# Patient Record
Sex: Female | Born: 1970 | Race: Black or African American | Hispanic: No | Marital: Married | Smoking: Never smoker
Health system: Southern US, Community
[De-identification: ages and names within clinical notes are randomized; demographics above are authoritative.]

## PROBLEM LIST (undated history)

## (undated) DIAGNOSIS — T7840XA Allergy, unspecified, initial encounter: Secondary | ICD-10-CM

## (undated) DIAGNOSIS — F419 Anxiety disorder, unspecified: Secondary | ICD-10-CM

## (undated) DIAGNOSIS — F32A Depression, unspecified: Secondary | ICD-10-CM

## (undated) DIAGNOSIS — I10 Essential (primary) hypertension: Secondary | ICD-10-CM

## (undated) DIAGNOSIS — F329 Major depressive disorder, single episode, unspecified: Secondary | ICD-10-CM

## (undated) HISTORY — PX: REDUCTION MAMMAPLASTY: SUR839

## (undated) HISTORY — PX: COSMETIC SURGERY: SHX468

## (undated) HISTORY — DX: Depression, unspecified: F32.A

## (undated) HISTORY — DX: Allergy, unspecified, initial encounter: T78.40XA

## (undated) HISTORY — DX: Anxiety disorder, unspecified: F41.9

## (undated) HISTORY — DX: Essential (primary) hypertension: I10

## (undated) HISTORY — DX: Major depressive disorder, single episode, unspecified: F32.9

## (undated) HISTORY — PX: OTHER SURGICAL HISTORY: SHX169

## (undated) HISTORY — PX: BREAST SURGERY: SHX581

---

## 2000-09-20 ENCOUNTER — Inpatient Hospital Stay (HOSPITAL_COMMUNITY): Admission: AD | Admit: 2000-09-20 | Discharge: 2000-09-23 | Payer: Self-pay | Admitting: *Deleted

## 2000-09-20 ENCOUNTER — Encounter (INDEPENDENT_AMBULATORY_CARE_PROVIDER_SITE_OTHER): Payer: Self-pay | Admitting: Specialist

## 2005-04-03 ENCOUNTER — Other Ambulatory Visit: Admission: RE | Admit: 2005-04-03 | Discharge: 2005-04-03 | Payer: Self-pay | Admitting: Obstetrics and Gynecology

## 2006-07-12 ENCOUNTER — Other Ambulatory Visit: Admission: RE | Admit: 2006-07-12 | Discharge: 2006-07-12 | Payer: Self-pay | Admitting: Obstetrics and Gynecology

## 2007-10-02 ENCOUNTER — Other Ambulatory Visit: Admission: RE | Admit: 2007-10-02 | Discharge: 2007-10-02 | Payer: Self-pay | Admitting: Obstetrics and Gynecology

## 2008-12-24 ENCOUNTER — Encounter: Admission: RE | Admit: 2008-12-24 | Discharge: 2008-12-24 | Payer: Self-pay | Admitting: Plastic Surgery

## 2008-12-29 ENCOUNTER — Encounter: Admission: RE | Admit: 2008-12-29 | Discharge: 2008-12-29 | Payer: Self-pay | Admitting: Plastic Surgery

## 2009-01-05 ENCOUNTER — Encounter (INDEPENDENT_AMBULATORY_CARE_PROVIDER_SITE_OTHER): Payer: Self-pay | Admitting: Plastic Surgery

## 2009-01-05 ENCOUNTER — Ambulatory Visit (HOSPITAL_BASED_OUTPATIENT_CLINIC_OR_DEPARTMENT_OTHER): Admission: RE | Admit: 2009-01-05 | Discharge: 2009-01-06 | Payer: Self-pay | Admitting: Plastic Surgery

## 2009-03-24 ENCOUNTER — Other Ambulatory Visit: Admission: RE | Admit: 2009-03-24 | Discharge: 2009-03-24 | Payer: Self-pay | Admitting: Obstetrics and Gynecology

## 2009-07-08 ENCOUNTER — Encounter: Admission: RE | Admit: 2009-07-08 | Discharge: 2009-07-08 | Payer: Self-pay | Admitting: Internal Medicine

## 2010-02-02 ENCOUNTER — Encounter: Admission: RE | Admit: 2010-02-02 | Discharge: 2010-02-02 | Payer: Self-pay | Admitting: Internal Medicine

## 2010-04-27 ENCOUNTER — Encounter: Admission: RE | Admit: 2010-04-27 | Discharge: 2010-04-27 | Payer: Self-pay | Admitting: Internal Medicine

## 2010-07-17 ENCOUNTER — Encounter: Payer: Self-pay | Admitting: Internal Medicine

## 2010-07-26 ENCOUNTER — Other Ambulatory Visit (HOSPITAL_COMMUNITY): Admission: RE | Admit: 2010-07-26 | Payer: 59 | Source: Ambulatory Visit | Admitting: Obstetrics and Gynecology

## 2010-07-26 ENCOUNTER — Other Ambulatory Visit (HOSPITAL_COMMUNITY)
Admission: RE | Admit: 2010-07-26 | Discharge: 2010-07-26 | Disposition: A | Discharge status: Home / Self Care | Payer: 59 | Source: Ambulatory Visit | Attending: Obstetrics and Gynecology | Admitting: Obstetrics and Gynecology

## 2010-07-26 ENCOUNTER — Other Ambulatory Visit: Payer: Self-pay | Admitting: Obstetrics and Gynecology

## 2010-07-26 DIAGNOSIS — Z01419 Encounter for gynecological examination (general) (routine) without abnormal findings: Secondary | ICD-10-CM | POA: Insufficient documentation

## 2010-07-26 DIAGNOSIS — Z113 Encounter for screening for infections with a predominantly sexual mode of transmission: Secondary | ICD-10-CM | POA: Insufficient documentation

## 2010-10-02 LAB — POCT I-STAT, CHEM 8
BUN: 14 mg/dL (ref 6–23)
Creatinine, Ser: 0.8 mg/dL (ref 0.4–1.2)
Hemoglobin: 14.3 g/dL (ref 12.0–15.0)
Potassium: 4.5 mEq/L (ref 3.5–5.1)
Sodium: 139 mEq/L (ref 135–145)
TCO2: 28 mmol/L (ref 0–100)

## 2010-10-02 LAB — POCT HEMOGLOBIN-HEMACUE: Hemoglobin: 12.3 g/dL (ref 12.0–15.0)

## 2010-11-08 NOTE — Op Note (Signed)
Christine Lloyd               ACCOUNT NO.:  [id number]   MEDICAL RECORD NO.:  [id number]          PATIENT TYPE:  AMB   LOCATION:  DSC                          FACILITY:  [facility]   PHYSICIAN:  Brantley Persons, M.D.DATE OF BIRTH:  31-Mar-1971   DATE OF PROCEDURE:  01/05/2009  DATE OF DISCHARGE:                               OPERATIVE REPORT   PREOPERATIVE DIAGNOSIS:  Bilateral macromastia.   POSTOPERATIVE DIAGNOSIS:  Bilateral macromastia.   PROCEDURE:  Bilateral reduction mammoplasties.   ATTENDING SURGEON:  Brantley Persons, MD   ANESTHESIA:  General.   ANESTHESIOLOGIST:  Sheldon Silvan, MD   ESTIMATED BLOOD LOSS:  150 mL.   FLUID REPLACEMENT:  3100 mL of crystalloid.   URINE OUTPUT:  310 mL.   COMPLICATIONS:  None.   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  She presents to undergo bilateral reduction mammoplasties.   PROCEDURE:  The patient was marked in preop holding area in the pattern  of Wise for the future bilateral reduction mammoplasties.  She was then  taken back to the OR, placed on the table in supine position.  After  adequate general anesthesia was obtained, the patient's chest was  prepped with Techni-Care and draped in sterile fashion.  The base of the  breasts had been injected with 1% lidocaine with epinephrine.  After  adequate hemostasis and anesthesia had taken effect, the procedure was  begun.   Both of the breast reductions were performed in the following similar  manner.  The nipple-areolar complexes were marked with a 45-mm nipple  marker.  The skin was then incised and de-epithelialized around the  nipple-areolar complex down to the inframammary crease in the inferior  pedicle pattern.  Next, the medial, superior, and lateral skin flaps  were elevated down to the chest wall.  The excess fat and glandular  tissue were removed from the  inferior pedicle.  The nipple-areolar  complex was examined and found to be pink and viable.  The wound was  irrigated with saline irrigation.  Meticulous hemostasis was obtained  with the Bovie electrocautery.  The inferior pedicle was centralized  using 3-0 Prolene suture.  A #10 JP flat fully fluted drain was placed  into the wound.  The skin flaps were then brought together at the  inverted T junction with a 2-0 Prolene suture.  The incisions were  stapled for temporary closure.  The breasts compared and found to have  good shape and symmetry.  The incisions were then closed from the medial  aspect of the JP drain to the medial aspect of the [facility] incision by first  placing a few 3-0 Monocryl sutures in the dermal layer to loosely tack  together and then both the dermal and cuticular layer were closed in a  single layer using a 2-0 Quill PDO barbed suture.  Lateral to the JP  drain, incision was closed using 3-0 Monocryl in the dermal layer,  followed by 3-0 Monocryl running intracuticular stitch on the skin.  The  vertical  limb of the Wise pattern was then closed using 3-0 Monocryl  suture.   The patient was then placed in the upright position.  The future  location of the nipple-areolar complexes was marked on both breast  mounds using the 45-mm nipple marker.  She was then placed back in the  recumbent position.   Both of the nipple-areolar complexes were brought out onto the breast  mounds in the following similar manner.  The skin was incised as marked  and excised full thickness into the soft tissue.  The nipple-areolar  complex was examined, felt to be pink and viable, then brought out  through this aperture and sewn in place using 4-0 Monocryl on the dermal  layer, followed by 5-0 Monocryl running intracuticular stitch on the  skin.  The 5-0 Monocryl suture was then brought down to close the  cuticular layer of the vertical limb as well.  The JP drain was sewn in  place using  3-0 nylon suture.  The incisions were dressed with benzoin  and Steri-Strips, and the nipples additionally with bacitracin ointment  and Adaptic.  The base of the breast and skin soft tissues were injected  with 0.5% Marcaine with epinephrine to provide a postsurgical anesthetic  block.  The incisions were dressed additionally with 4x4s and ABD pads  in the axillary areas.  The patient was then placed into a light  postoperative support bra.  There were are no complications.  The  patient tolerated the procedure well.  The final needle and sponge  counts were reported to be correct at the end of the case.   The patient was then awakened from general anesthesia.  She was then  taken to the recovery room and recovered without any complications.  It  is planned for the patient to go home from the [facility] rather  than stay overnight, this is appropriate.  Prior to discharge, both the  patient and her husband were given proper postoperative wound care  instructions including care of the JP drain.  She will then be  discharged home once stable in the care of her husband.   Follow up appointment will be Friday in the office.           ______________________________  Brantley Persons, M.D.     MC/MEDQ  D:  01/05/2009  T:  01/06/2009  Job:  [id number]

## 2010-11-11 NOTE — Op Note (Signed)
[place] [facility]  Patient:    Christine Lloyd, Christine Lloyd                        MRN: [id number] Proc. Date: 09/20/00 Attending:  Kathreen Cosier, M.D.                           Operative Report  PREOPERATIVE DIAGNOSIS:       Nonreassuring fetal heart rate tracing.  SURGEON:                      Kathreen Cosier, M.D.  ANESTHESIA:                   Epidural.  PROCEDURE:                    The patient was placed on the operating table in the supine position.  Abdomen was prepped and draped.  Bladder emptied with a Foley catheter.  A transverse suprapubic incision was made and carried down through the fascia.  The fascia was cleaned and incised the length of the incision.  Erectile muscles were retracted laterally.  The peritoneum was incised longitudinally.  A transverse incision made on the viscera peritoneum above the bladder, and the bladder mobilized inferiorly.  A transverse low uterine incision was made.  Fluid was slightly meconium stained.  The baby was DeLeed prior to delivery of the shoulders, and the fluid appeared to clear. There was no nuchal cord and the baby was delivered in the OP position, of a female Apgar 9 and 9, weighing 7 pounds 10 ounces.  The cord pH was 7.23. Placenta was anterior and removed manually.  The placenta was sent to pathology.  Uterine cavity cleaned with dry laps.  Uterine incision closed with interlocking suture of #1 chromic, including myometrium and endometrium. Myometrium embrocated with #1 chromic.  Hemostasis was satisfactory.  Bladder flap reattached with 2-0 chromic.  The uterus was contracted.  Tubes and ovaries normal.  Abdomen closed in layers with the peritoneum continuous with 2-0 chromic, fascia continuous with 2-0 Dexon.  Skin was closed with subcuticular stitch of 0 plain.  Estimated blood loss: 500 cc. DD:  09/20/00 TD:  09/21/00 Job: [id number]

## 2010-11-11 NOTE — Discharge Summary (Signed)
[facility]  Patient:    SABEEN, PIECHOCKI                        MRN: [id number] Adm. Date:  [id number] Disc. Date: [id number] Attending:  Venita Sheffield                           Discharge Summary  HISTORY:                      The patient is a 40 year old gravida 3, para 1-0-1-1, [facility] October 04, 2000, who was admitted in labor, 4-5 cm, 80%, vertex at -3. She had a negative GBS.  HOSPITAL COURSE:              The patient progressed to 7 cm, 100%, vertex, -2. She started having late decelerations. Membranes were ruptured and the fluid was meconium stained. Amnioinfusion was begun; however, she had no change in decelerations, still having late decelerations. She was given terbutaline 0.25 mg subcutaneously while preparing for a cesarean section. She had a low transverse cesarean section. She had a female from the OP position, Apgars 9 and 9. Cord pH was 7.23. The baby weighed 7 pounds 10 ounces. Placenta was anterior and was sent to pathology. Postoperatively, she did well and was discharged home on the third postoperative day ambulatory and on a regular diet.  DISCHARGE MEDICATIONS:        Tylenol #3.  DISCHARGE FOLLOWUP:           The patient is to see me in six weeks.  DISCHARGE DIAGNOSIS:          Primary low transverse cesarean section at term for a nonreassuring fetal heart rate tracing. DD:  10/25/00 TD:  10/25/00 Job: [id number]

## 2010-11-11 NOTE — H&P (Signed)
[facility]  Patient:    Christine Lloyd, Christine Lloyd                        MRN: [id number] Adm. Date:  [id number] Attending:  Venita Sheffield                         History and Physical                                Patient is a 40 year old gravida 3, para 1-0-0-1 [facility] October 04, 2000 who was admitted in labor at 7:10 a.m. on September 20, 2000. The cervix was 4-5 cm, 80% with the vertex at a -3 and membranes bulging.  She had a negative group B strep.  The patient was seen at approximately 11:30. She was 7 cm.  She already had an epidural in and it was noted that she was having occasional variables and also some late decelerations.  She had already been ______ to administration.  Cervix was 7 cm, 100% with a vertex of -2. Membranes were ruptured and the fluid was meconium stained.  An IUPC and scalp lead were applied and amnio-infusion began.  With each contraction she had large late decelerations.  No change with O2 or position change.  She was given terbutaline 0.25 subcutaneously to stop her contractions while the OR was prepared.  It was decided she would deliver by cesarean section for nonreassuring fetal heart rate tracing.  PHYSICAL EXAMINATION  GENERAL:                      Well-developed female.  HEENT:                        Negative.  LUNGS:                        Clear.  HEART:                        Regular rhythm.  No murmurs or gallops.  ABDOMEN:                      Term sized uterus.  PELVIC:                       As described above.  EXTREMITIES:                  Negative. DD:  09/20/00 TD:  09/20/00 Job: [id number]

## 2011-12-04 ENCOUNTER — Other Ambulatory Visit: Payer: Self-pay | Admitting: Internal Medicine

## 2011-12-04 DIAGNOSIS — Z1231 Encounter for screening mammogram for malignant neoplasm of breast: Secondary | ICD-10-CM

## 2011-12-11 ENCOUNTER — Ambulatory Visit
Admission: RE | Admit: 2011-12-11 | Discharge: 2011-12-11 | Disposition: A | Discharge status: Home / Self Care | Payer: 59 | Source: Ambulatory Visit | Attending: Internal Medicine | Admitting: Internal Medicine

## 2011-12-11 DIAGNOSIS — Z1231 Encounter for screening mammogram for malignant neoplasm of breast: Secondary | ICD-10-CM

## 2012-01-23 ENCOUNTER — Other Ambulatory Visit: Payer: Self-pay | Admitting: Obstetrics and Gynecology

## 2012-01-23 ENCOUNTER — Other Ambulatory Visit (HOSPITAL_COMMUNITY)
Admission: RE | Admit: 2012-01-23 | Discharge: 2012-01-23 | Disposition: A | Discharge status: Home / Self Care | Payer: 59 | Source: Ambulatory Visit | Attending: Obstetrics and Gynecology | Admitting: Obstetrics and Gynecology

## 2012-01-23 DIAGNOSIS — Z1151 Encounter for screening for human papillomavirus (HPV): Secondary | ICD-10-CM | POA: Insufficient documentation

## 2012-01-23 DIAGNOSIS — Z01419 Encounter for gynecological examination (general) (routine) without abnormal findings: Secondary | ICD-10-CM | POA: Insufficient documentation

## 2012-12-06 ENCOUNTER — Ambulatory Visit: Payer: 59 | Admitting: Physician Assistant

## 2012-12-06 VITALS — BP 164/94 | HR 95 | Temp 98.5°F | Resp 16 | Ht 63.0 in | Wt 195.0 lb

## 2012-12-06 DIAGNOSIS — N760 Acute vaginitis: Secondary | ICD-10-CM

## 2012-12-06 DIAGNOSIS — B373 Candidiasis of vulva and vagina: Secondary | ICD-10-CM

## 2012-12-06 DIAGNOSIS — B3731 Acute candidiasis of vulva and vagina: Secondary | ICD-10-CM

## 2012-12-06 DIAGNOSIS — N899 Noninflammatory disorder of vagina, unspecified: Secondary | ICD-10-CM

## 2012-12-06 DIAGNOSIS — N898 Other specified noninflammatory disorders of vagina: Secondary | ICD-10-CM

## 2012-12-06 DIAGNOSIS — B9689 Other specified bacterial agents as the cause of diseases classified elsewhere: Secondary | ICD-10-CM

## 2012-12-06 LAB — POCT URINALYSIS DIPSTICK
Bilirubin, UA: NEGATIVE
Glucose, UA: NEGATIVE
Ketones, UA: NEGATIVE
Nitrite, UA: NEGATIVE
pH, UA: 5.5

## 2012-12-06 LAB — POCT WET PREP WITH KOH: Yeast Wet Prep HPF POC: POSITIVE

## 2012-12-06 LAB — POCT UA - MICROSCOPIC ONLY: Yeast, UA: NEGATIVE

## 2012-12-06 MED ORDER — FLUCONAZOLE 150 MG PO TABS: 150.0000 mg | ORAL_TABLET | Freq: Once | ORAL | Status: DC

## 2012-12-06 MED ORDER — METRONIDAZOLE 500 MG PO TABS: 500.0000 mg | ORAL_TABLET | Freq: Two times a day (BID) | ORAL | Status: DC

## 2012-12-06 NOTE — Patient Instructions (Addendum)
Take 1 fluconazole (Diflucan) pill today.  Begin taking the metronidazole (Flagyl) today as well.  Be sure to finish the full course.  On the day after you finish the Flagyl, take the other fluconazole.  Please let me know if symptoms are not improving.   Bacterial Vaginosis Bacterial vaginosis (BV) is a vaginal infection where the normal balance of bacteria in the vagina is disrupted. The normal balance is then replaced by an overgrowth of certain bacteria. There are several different kinds of bacteria that can cause BV. BV is the most common vaginal infection in women of childbearing age. CAUSES   The cause of BV is not fully understood. BV develops when there is an increase or imbalance of harmful bacteria.  Some activities or behaviors can upset the normal balance of bacteria in the vagina and put women at increased risk including:  Having a new sex partner or multiple sex partners.  Douching.  Using an intrauterine device (IUD) for contraception.  It is not clear what role sexual activity plays in the development of BV. However, women that have never had sexual intercourse are rarely infected with BV. Women do not get BV from toilet seats, bedding, swimming pools or from touching objects around them.  SYMPTOMS   Grey vaginal discharge.  A fish-like odor with discharge, especially after sexual intercourse.  Itching or burning of the vagina and vulva.  Burning or pain with urination.  Some women have no signs or symptoms at all. DIAGNOSIS  Your caregiver must examine the vagina for signs of BV. Your caregiver will perform lab tests and look at the sample of vaginal fluid through a microscope. They will look for bacteria and abnormal cells (clue cells), a pH test higher than 4.5, and a positive amine test all associated with BV.  RISKS AND COMPLICATIONS   Pelvic inflammatory disease (PID).  Infections following gynecology surgery.  Developing HIV.  Developing herpes  virus. TREATMENT  Sometimes BV will clear up without treatment. However, all women with symptoms of BV should be treated to avoid complications, especially if gynecology surgery is planned. Female partners generally do not need to be treated. However, BV may spread between female sex partners so treatment is helpful in preventing a recurrence of BV.   BV may be treated with antibiotics. The antibiotics come in either pill or vaginal cream forms. Either can be used with nonpregnant or pregnant women, but the recommended dosages differ. These antibiotics are not harmful to the baby.  BV can recur after treatment. If this happens, a second round of antibiotics will often be prescribed.  Treatment is important for pregnant women. If not treated, BV can cause a premature delivery, especially for a pregnant woman who had a premature birth in the past. All pregnant women who have symptoms of BV should be checked and treated.  For chronic reoccurrence of BV, treatment with a type of prescribed gel vaginally twice a week is helpful. HOME CARE INSTRUCTIONS   Finish all medication as directed by your caregiver.  Do not have sex until treatment is completed.  Tell your sexual partner that you have a vaginal infection. They should see their caregiver and be treated if they have problems, such as a mild rash or itching.  Practice safe sex. Use condoms. Only have 1 sex partner. PREVENTION  Basic prevention steps can help reduce the risk of upsetting the natural balance of bacteria in the vagina and developing BV:  Do not have sexual intercourse (be abstinent).  Do not douche.  Use all of the medicine prescribed for treatment of BV, even if the signs and symptoms go away.  Tell your sex partner if you have BV. That way, they can be treated, if needed, to prevent reoccurrence. SEEK MEDICAL CARE IF:   Your symptoms are not improving after 3 days of treatment.  You have increased discharge, pain, or  fever. MAKE SURE YOU:   Understand these instructions.  Will watch your condition.  Will get help right away if you are not doing well or get worse. FOR MORE INFORMATION  Division of STD Prevention (DSTDP), Centers for Disease Control and Prevention: SolutionApps.[employer].za American Social Health Association (ASHA): www.ashastd.org  Document Released: 06/12/2005 Document Revised: 09/04/2011 Document Reviewed: 12/03/2008 [facility] Patient Information 2014 [place], [place].   Monilial Vaginitis Vaginitis in a soreness, swelling and redness (inflammation) of the vagina and vulva. Monilial vaginitis is not a sexually transmitted infection. CAUSES  Yeast vaginitis is caused by yeast (candida) that is normally found in your vagina. With a yeast infection, the candida has overgrown in number to a point that upsets the chemical balance. SYMPTOMS   White, thick vaginal discharge.  Swelling, itching, redness and irritation of the vagina and possibly the lips of the vagina (vulva).  Burning or painful urination.  Painful intercourse. DIAGNOSIS  Things that may contribute to monilial vaginitis are:  Postmenopausal and virginal states.  Pregnancy.  Infections.  Being tired, sick or stressed, especially if you had monilial vaginitis in the past.  Diabetes. Good control will help lower the chance.  Birth control pills.  Tight fitting garments.  Using bubble bath, feminine sprays, douches or deodorant tampons.  Taking certain medications that kill germs (antibiotics).  Sporadic recurrence can occur if you become ill. TREATMENT  Your caregiver will give you medication.  There are several kinds of anti monilial vaginal creams and suppositories specific for monilial vaginitis. For recurrent yeast infections, use a suppository or cream in the vagina 2 times a week, or as directed.  Anti-monilial or steroid cream for the itching or irritation of the vulva may also be used. Get your  caregiver's permission.  Painting the vagina with methylene blue solution may help if the monilial cream does not work.  Eating yogurt may help prevent monilial vaginitis. HOME CARE INSTRUCTIONS   Finish all medication as prescribed.  Do not have sex until treatment is completed or after your caregiver tells you it is okay.  Take warm sitz baths.  Do not douche.  Do not use tampons, especially scented ones.  Wear cotton underwear.  Avoid tight pants and panty hose.  Tell your sexual partner that you have a yeast infection. They should go to their caregiver if they have symptoms such as mild rash or itching.  Your sexual partner should be treated as well if your infection is difficult to eliminate.  Practice safer sex. Use condoms.  Some vaginal medications cause latex condoms to fail. Vaginal medications that harm condoms are:  Cleocin cream.  Butoconazole (Femstat).  Terconazole (Terazol) vaginal suppository.  Miconazole (Monistat) (may be purchased over the counter). SEEK MEDICAL CARE IF:   You have a temperature by mouth above 102 F (38.9 C).  The infection is getting worse after 2 days of treatment.  The infection is not getting better after 3 days of treatment.  You develop blisters in or around your vagina.  You develop vaginal bleeding, and it is not your menstrual period.  You have pain when you urinate.  You develop intestinal problems.  You have pain with sexual intercourse. Document Released: 03/22/2005 Document Revised: 09/04/2011 Document Reviewed: 12/04/2008 [facility] Patient Information 2014 [place], [place].

## 2012-12-06 NOTE — Progress Notes (Signed)
Subjective:    Patient ID: Christine Lloyd, female    DOB: Jul 14, 1970, 42 y.o.   MRN: [id number]  HPI   Christine Lloyd is a very pleasant 42 yr old female here complaining of 1 wk of vaginal itching and discomfort.  Has noted a little bit of discharge.  Denies urinary symptoms.  Did note some discomfort during intercourse this week.  Has had BV and yeast in the past, though it has been over a year since the last episode.  Sexually active with 1 female partner, no concern for STI.  Mirena for contraception.  Does use two different types of soap and thinks that maybe switching between the two has caused her latest symptoms.  She denies fever, chills, abd pain, nausea, vomiting.     Review of Systems  Constitutional: Negative for fever and chills.  HENT: Negative.   Respiratory: Negative.   Cardiovascular: Negative.   Gastrointestinal: Negative.   Genitourinary: Positive for vaginal discharge and dyspareunia. Negative for dysuria, frequency, hematuria, flank pain, genital sores and menstrual problem.  Musculoskeletal: Negative.   Skin: Negative.   Neurological: Negative.        Objective:   Physical Exam  Vitals reviewed. Constitutional: She is oriented to person, place, and time. She appears well-developed and well-nourished. No distress.  HENT:  Head: Normocephalic and atraumatic.  Eyes: Conjunctivae are normal. No scleral icterus.  Cardiovascular: Normal rate, regular rhythm and normal heart sounds.   Pulmonary/Chest: Effort normal and breath sounds normal. She has no wheezes. She has no rales.  Abdominal: Soft. There is no tenderness.  Genitourinary: Uterus normal. There is no rash, tenderness or lesion on the right labia. There is no rash, tenderness or lesion on the left labia. Cervix exhibits no motion tenderness, no discharge and no friability. Right adnexum displays no mass, no tenderness and no fullness. Left adnexum displays no mass, no tenderness and no fullness. Vaginal  discharge (thick, white) found.  IUD strings palpable  Neurological: She is alert and oriented to person, place, and time.  Skin: Skin is warm and dry.  Psychiatric: She has a normal mood and affect. Her behavior is normal.     Results for orders placed in visit on 12/06/12  POCT UA - MICROSCOPIC ONLY      Result Value Range   WBC, Ur, HPF, POC tntc     RBC, urine, microscopic tntc     Bacteria, U Microscopic 4+     Mucus, UA large     Epithelial cells, urine per micros tntc     Crystals, Ur, HPF, POC neg     Casts, Ur, LPF, POC neg     Yeast, UA neg    POCT URINALYSIS DIPSTICK      Result Value Range   Color, UA yellow     Clarity, UA cloudy     Glucose, UA neg     Bilirubin, UA neg     Ketones, UA neg     Spec Grav, UA 1.025     Blood, UA mod     pH, UA 5.5     Protein, UA neg     Urobilinogen, UA 0.2     Nitrite, UA neg     Leukocytes, UA large (3+)    POCT WET PREP WITH KOH      Result Value Range   Trichomonas, UA Negative     Clue Cells Wet Prep HPF POC 1-6     Epithelial Wet Prep HPF POC  8-23     Yeast Wet Prep HPF POC pos     Bacteria Wet Prep HPF POC 3+     RBC Wet Prep HPF POC 0-6     WBC Wet Prep HPF POC 12-31     KOH Prep POC Negative          Assessment & Plan:  Vaginal candidiasis - Plan: fluconazole (DIFLUCAN) 150 MG tablet  Bacterial vaginosis - Plan: metroNIDAZOLE (FLAGYL) 500 MG tablet  Vaginal irritation - Plan: POCT UA - Microscopic Only, POCT urinalysis dipstick, POCT Wet Prep with KOH   Christine Lloyd is a very pleasant 42 yr old female with 1 wk of vaginal discharge/irritation.  Wet prep reveals clues and yeast.  Will treat with fluconazole x 1 today.  Begin flagyl BID x 7 days. Repeat fluconazole after completing flagyl.  If symptoms worsening or not improving, pt to RTC.

## 2013-02-18 ENCOUNTER — Other Ambulatory Visit: Payer: Self-pay

## 2013-02-18 DIAGNOSIS — Z1231 Encounter for screening mammogram for malignant neoplasm of breast: Secondary | ICD-10-CM

## 2013-02-19 ENCOUNTER — Encounter: Payer: Self-pay | Admitting: *Deleted

## 2013-02-19 ENCOUNTER — Encounter: Payer: 59 | Attending: Obstetrics and Gynecology | Admitting: *Deleted

## 2013-02-19 VITALS — Ht 62.0 in | Wt 198.2 lb

## 2013-02-19 DIAGNOSIS — Z713 Dietary counseling and surveillance: Secondary | ICD-10-CM | POA: Insufficient documentation

## 2013-02-19 DIAGNOSIS — E669 Obesity, unspecified: Secondary | ICD-10-CM

## 2013-02-19 NOTE — Progress Notes (Signed)
  Medical Nutrition Therapy:  Appt start time: 0930 end time:  1030.  Assessment:  Primary concerns today: Semiah is here for nutrition counseling pertaining to obesity. She states that she resembles her mom's physique.  Mom's family is short and heavier. She has tried various diet throughout her life: Weight Watchers, [place], HCG, low carb, low calorie, etc. Her lowest adult weight was about 125 lb in college. She is currently at her heaviest weight. She maintains weight around 170 lb.  She feels comfortable around 145 lb. Household is comprised of Daneli, husband, and 2 children.  She does the shopping and most of the cooking.   The family eats out 3 days/week.  She doesn't eat at the kitchen table, and mostly she is on the couch watching tv or in the bedroom.  They do not eat together as a family, except maybe on sundays.  She reports being a fast eater.    MEDICATIONS: see list   DIETARY INTAKE:  Usual eating pattern includes 2 meals and 0-2 snacks per day.  Everyday foods include starches, fatty protein, sweets, vegetables.  Avoided foods include fried pork chops, breads, high fat foods.    24-hr recall:  B ( AM): cereal with lactaid sometimes.  Might drink water or coffee.  Few days might get [place] bacon and boiled egg from cafeteria.  usually skips during week.  Might cook on weekend- bacon, eggs, toast, grits Snk ( AM): not during the week  L ( PM): might go out.  Tries to eat salads from [employer].  Likes to eat bread but tries to limit herself Snk ( PM): not much during the week, but snacks more on the weekends on cookies or fruit or chips D ( PM): might go out and get burger and dessert.  Likes to eat sweets more now than ever; spaghetti at home.  Might end up cooking multiple meals for different family members, but is trying to move away from that practice.  Uses leaner ground beef.  Might have burgers at home with oven fries; baked chicken, fried fish.   Used to not fry foods  until marriage.  Kids are picky eaters and they like the frozen foods.  Also serves [place] parts; casseroles Snk ( PM): not usually Beverages: water, rarely wants soda, sometimes drinks juice  Usual physical activity: went to gym for a month or so, but then hurt her back so hasn't been back.  She tries to run around with the dog at home. Reports feeling better when she is active  Estimated energy needs: 1600 calories 180 g carbohydrates 120 g protein 44 g fat  Progress Towards Goal(s):  In progress.   Nutritional Diagnosis:  [place]-3.4 Unintentional weight gain As related to metabolic decline related to limited adherance to internal hunger and fullness cues.  As evidenced by 25 pound weight gain over the past few years.    Intervention:  Did not have much time to discuss nutrition education/intervention because Chirstine had another appointment at 10:30.  We will discuss Intuitive Eating more fully next visit.  Instructed her to be more mindful of her hunger and fullness cues and how her body is feeling.  Suggested mentally checking in to gauge mood, energy state, and hunger every few hours and to gauge fullness as she is eating  Monitoring/Evaluation:  Dietary intake, exercise, mindfullness, and body weight in a few week(s).

## 2013-03-20 ENCOUNTER — Ambulatory Visit
Admission: RE | Admit: 2013-03-20 | Discharge: 2013-03-20 | Disposition: A | Discharge status: Home / Self Care | Payer: 59 | Source: Ambulatory Visit

## 2013-03-20 DIAGNOSIS — Z1231 Encounter for screening mammogram for malignant neoplasm of breast: Secondary | ICD-10-CM

## 2013-03-25 ENCOUNTER — Encounter: Payer: 59 | Attending: Obstetrics and Gynecology | Admitting: Dietician

## 2013-03-25 VITALS — Ht 62.0 in | Wt 197.3 lb

## 2013-03-25 DIAGNOSIS — Z713 Dietary counseling and surveillance: Secondary | ICD-10-CM | POA: Insufficient documentation

## 2013-03-25 DIAGNOSIS — E669 Obesity, unspecified: Secondary | ICD-10-CM | POA: Insufficient documentation

## 2013-03-25 NOTE — Patient Instructions (Addendum)
Eat 3 meals per day, and 2-3 snacks per day if needed. Fill half of your plate with vegetables, limit starch to a quarter of the plate, and protein to a quarter of the plate. Consider using frozen vegetables or chopped and washed vegetable to your plate. Eat meals/snacks when you are hungry and stop when you are no longer.  Keep working on eating meals at the table with no TV.

## 2013-03-25 NOTE — Progress Notes (Signed)
  Medical Nutrition Therapy:  Appt start time: 0930 end time:  1030.  Assessment:  Primary concerns today: Christine Lloyd is here for follow up for obesity today. States that she is limiting sugars and bread. Trying to eat something for breakfast. Eating more meals at home and not eating lunch out during the week.    Wt Readings from Last 3 Encounters:  03/25/13 197 lb 4.8 oz (89.495 kg)  02/19/13 198 lb 3.2 oz (89.903 kg)  12/06/12 195 lb (88.451 kg)   Ht Readings from Last 3 Encounters:  03/25/13 5\' 2"  (1.575 m)  02/19/13 5\' 2"  (1.575 m)  12/06/12 5\' 3"  (1.6 m)   Body mass index is 36.08 kg/(m^2). @BMIFA @ Normalized weight-for-age data available only for age 65 to 20 years. Normalized stature-for-age data available only for age 65 to 20 years.  MEDICATIONS: see list   DIETARY INTAKE:  Usual eating pattern includes 2 meals and 0-2 snacks per day.  Everyday foods include starches, fatty protein, sweets, vegetables.  Avoided foods include fried pork chops, breads, high fat foods.    24-hr recall:  B ( AM): Might drink water or coffee.  Few days might get [place] bacon and boiled egg from cafeteria.  Yogurt with sweetener.  Might cook on weekend- bacon, eggs, toast, grits Snk ( AM): not during the week  L ( PM): bringing lean meats and [place] with lettuce from home.    Snk ( PM): having more meat from lunch. D ( PM): salmon and [place] burger (no bread) with vegetable Snk ( PM): not usually Beverages: water, rarely wants soda, sometimes drinks juice  Usual physical activity: went to gym for a month or so, but then hurt her back so hasn't been back. Doing floor exercises now.   Estimated energy needs: 1600 calories 180 g carbohydrates 120 g protein 44 g fat  Progress Towards Goal(s):  In progress.   Nutritional Diagnosis:  [place]-3.4 Unintentional weight gain As related to metabolic decline related to limited adherance to internal hunger and fullness cues.  As evidenced by 25 pound  weight gain over the past few years.    Intervention:  Nutrition counseling provided. Recommended paying attention to hunger and fullness cues and structuring her environment so she has less treats in her home and office and more access to vegetables and healthy proteins.   Plan: Eat 3 meals per day, and 2-3 snacks per day if needed. Fill half of your plate with vegetables, limit starch to a quarter of the plate, and protein to a quarter of the plate. Consider using frozen vegetables or chopped and washed vegetable to your plate. Eat meals/snacks when you are hungry and stop when you are no longer.  Keep working on eating meals at the table with no TV.    Monitoring/Evaluation:  Dietary intake, exercise, mindfullness, and body weight in 6 week(s).

## 2013-03-26 ENCOUNTER — Other Ambulatory Visit: Payer: Self-pay | Admitting: Internal Medicine

## 2013-03-26 DIAGNOSIS — R928 Other abnormal and inconclusive findings on diagnostic imaging of breast: Secondary | ICD-10-CM

## 2013-04-09 ENCOUNTER — Ambulatory Visit
Admission: RE | Admit: 2013-04-09 | Discharge: 2013-04-09 | Disposition: A | Discharge status: Home / Self Care | Payer: 59 | Source: Ambulatory Visit | Attending: Internal Medicine | Admitting: Internal Medicine

## 2013-04-09 DIAGNOSIS — R928 Other abnormal and inconclusive findings on diagnostic imaging of breast: Secondary | ICD-10-CM

## 2013-05-06 ENCOUNTER — Ambulatory Visit: Payer: 59 | Admitting: Dietician

## 2014-03-20 ENCOUNTER — Ambulatory Visit (INDEPENDENT_AMBULATORY_CARE_PROVIDER_SITE_OTHER): Payer: 59 | Admitting: Family Medicine

## 2014-03-20 VITALS — BP 128/86 | HR 90 | Temp 98.3°F | Resp 16 | Ht 62.0 in | Wt 170.8 lb

## 2014-03-20 DIAGNOSIS — K649 Unspecified hemorrhoids: Secondary | ICD-10-CM

## 2014-03-20 DIAGNOSIS — K648 Other hemorrhoids: Secondary | ICD-10-CM

## 2014-03-20 NOTE — Progress Notes (Signed)
[facility] [address] [place] [address] [phone]  Date:  03/20/2014   Name:  Christine Lloyd   DOB:  July 03, 1970   MRN:  [id number]  PCP:  Georgann Housekeeper, MD    Chief Complaint: Hemorrhoids   History of Present Illness:  Christine Lloyd is a 43 y.o. very pleasant female patient who presents with the following:  About 10 days ago she noted what she thinks are hemorrhoids.  She has tried some OTC medications, and tuck's pads.  The area was painful, but this has gotten better.  She had these around 10 years ago but otherwise has not generally been bothered by hemorrhoids.   She has not noted any bleeding.  She has not yet had a colonoscopy as she is under 50 She is able to have stools.  She has not noted constipation, but did have some diarrhea prior to developing these.   She is otherwise generally healthy.  No vaginal sx.  LMP 2 weeks ago.   There are no active problems to display for this patient.   Past Medical History  Diagnosis Date  . Allergy   . Hypertension     Past Surgical History  Procedure Laterality Date  . Cosmetic surgery    . Breast surgery    . Cesarean section      History  Substance Use Topics  . Smoking status: Never Smoker   . Smokeless tobacco: Not on file  . Alcohol Use: No    Family History  Problem Relation Age of Onset  . Hypertension Father   . Diabetes Brother   . Diabetes Maternal Aunt   . Diabetes Other     No Known Allergies  Medication list has been reviewed and updated.  Current Outpatient Prescriptions on File Prior to Visit  Medication Sig Dispense Refill  . benazepril-hydrochlorthiazide (LOTENSIN HCT) 10-12.5 MG per tablet Take 1 tablet by mouth daily.      Marland Kitchen amLODipine-benazepril (LOTREL) 10-20 MG per capsule Take 1 capsule by mouth daily.       No current facility-administered medications on file prior to visit.    Review of Systems:  As per HPI- otherwise  negative.   Physical Examination: Filed Vitals:   03/20/14 1648  BP: 128/86  Pulse: 90  Temp: 98.3 F (36.8 C)  Resp: 16   Filed Vitals:   03/20/14 1648  Height:  (1.575 m)  Weight: 170 lb 12.8 oz (77.474 kg)   Body mass index is 31.23 kg/(m^2). Ideal Body Weight: Weight in (lb) to have BMI = 25: 136.4  GEN: WDWN, NAD, Non-toxic, A & O x 3, overweight, looks well HEENT: Atraumatic, Normocephalic. Neck supple. No masses, No LAD. Ears and Nose: No external deformity. CV: RRR, No M/G/R. No JVD. No thrill. No extra heart sounds. PULM: CTA B, no wheezes, crackles, rhonchi. No retractions. No resp. distress. No accessory muscle use. ABD: S, NT, ND EXTR: No c/c/e NEURO Normal gait.  PSYCH: Normally interactive. Conversant. Not depressed or anxious appearing.  Calm demeanor.  Rectal: at this time no significant hemorrhoids are noted.  No evidence of a fissure, no bleeding or tenderness with exam.     Assessment and Plan: Other hemorrhoids  Reassurance that at this time her hemorrhoids are minimal.  They are not currently bothering her too much but she wanted to be sure all is ok.  She will continue to use OTC treatments as needed and will let me know  if not back to normal soon- if still symptomatic will refer to GI    Signed Abbe Amsterdam, MD

## 2014-03-20 NOTE — Patient Instructions (Signed)
At this point I would not do much more with your hemorrhoids- I think they will settle down ok.  However if you continue to have trouble over the next 1-2 weeks give me a call; Sooner if worse.

## 2014-07-21 ENCOUNTER — Other Ambulatory Visit: Payer: Self-pay | Admitting: Obstetrics and Gynecology

## 2014-07-21 ENCOUNTER — Other Ambulatory Visit (HOSPITAL_COMMUNITY)
Admission: RE | Admit: 2014-07-21 | Discharge: 2014-07-21 | Disposition: A | Discharge status: Home / Self Care | Payer: 59 | Source: Ambulatory Visit | Attending: Obstetrics and Gynecology | Admitting: Obstetrics and Gynecology

## 2014-07-21 DIAGNOSIS — Z01419 Encounter for gynecological examination (general) (routine) without abnormal findings: Secondary | ICD-10-CM | POA: Insufficient documentation

## 2014-07-21 DIAGNOSIS — Z1151 Encounter for screening for human papillomavirus (HPV): Secondary | ICD-10-CM | POA: Insufficient documentation

## 2014-07-23 LAB — CYTOLOGY - PAP

## 2014-07-29 ENCOUNTER — Other Ambulatory Visit: Payer: Self-pay

## 2014-07-29 DIAGNOSIS — Z1231 Encounter for screening mammogram for malignant neoplasm of breast: Secondary | ICD-10-CM

## 2014-08-17 ENCOUNTER — Ambulatory Visit
Admission: RE | Admit: 2014-08-17 | Discharge: 2014-08-17 | Disposition: A | Discharge status: Home / Self Care | Payer: 59 | Source: Ambulatory Visit

## 2014-08-17 ENCOUNTER — Other Ambulatory Visit: Payer: Self-pay

## 2014-08-17 DIAGNOSIS — Z1231 Encounter for screening mammogram for malignant neoplasm of breast: Secondary | ICD-10-CM

## 2015-03-09 ENCOUNTER — Ambulatory Visit: Payer: 59 | Admitting: Podiatry

## 2015-04-08 ENCOUNTER — Ambulatory Visit (INDEPENDENT_AMBULATORY_CARE_PROVIDER_SITE_OTHER): Payer: 59 | Admitting: Podiatry

## 2015-04-08 ENCOUNTER — Encounter: Payer: Self-pay | Admitting: Podiatry

## 2015-04-08 ENCOUNTER — Ambulatory Visit (INDEPENDENT_AMBULATORY_CARE_PROVIDER_SITE_OTHER): Payer: 59

## 2015-04-08 VITALS — BP 109/70 | HR 92 | Resp 12

## 2015-04-08 DIAGNOSIS — M2041 Other hammer toe(s) (acquired), right foot: Secondary | ICD-10-CM

## 2015-04-08 NOTE — Patient Instructions (Signed)
Pre-Operative Instructions  Congratulations, you have decided to take an important step to improving your quality of life.  You can be assured that the doctors of [facility] will be with you every step of the way.  1. Plan to be at the surgery center/hospital at least 1 (one) hour prior to your scheduled time unless otherwise directed by the surgical center/hospital staff.  You must have a responsible adult accompany you, remain during the surgery and drive you home.  Make sure you have directions to the surgical center/hospital and know how to get there on time. 2. For hospital based surgery you will need to obtain a history and physical form from your family physician within 1 month prior to the date of surgery- we will give you a form for you primary physician.  3. We make every effort to accommodate the date you request for surgery.  There are however, times where surgery dates or times have to be moved.  We will contact you as soon as possible if a change in schedule is required.   4. No Aspirin/Ibuprofen for one week before surgery.  If you are on aspirin, any non-steroidal anti-inflammatory medications (Mobic, Aleve, Ibuprofen) you should stop taking it 7 days prior to your surgery.  You make take Tylenol  For pain prior to surgery.  5. Medications- If you are taking daily heart and blood pressure medications, seizure, reflux, allergy, asthma, anxiety, pain or diabetes medications, make sure the surgery center/hospital is aware before the day of surgery so they may notify you which medications to take or avoid the day of surgery. 6. No food or drink after midnight the night before surgery unless directed otherwise by surgical center/hospital staff. 7. No alcoholic beverages 24 hours prior to surgery.  No smoking 24 hours prior to or 24 hours after surgery. 8. Wear loose pants or shorts- loose enough to fit over bandages, boots, and casts. 9. No slip on shoes, sneakers are best. 10. Bring  your boot with you to the surgery center/hospital.  Also bring crutches or a walker if your physician has prescribed it for you.  If you do not have this equipment, it will be provided for you after surgery. 11. If you have not been contracted by the surgery center/hospital by the day before your surgery, call to confirm the date and time of your surgery. 12. Leave-time from work may vary depending on the type of surgery you have.  Appropriate arrangements should be made prior to surgery with your employer. 13. Prescriptions will be provided immediately following surgery by your doctor.  Have these filled as soon as possible after surgery and take the medication as directed. 14. Remove nail polish on the operative foot. 15. Wash the night before surgery.  The night before surgery wash the foot and leg well with the antibacterial soap provided and water paying special attention to beneath the toenails and in between the toes.  Rinse thoroughly with water and dry well with a towel.  Perform this wash unless told not to do so by your physician.  Enclosed: 1 Ice pack (please put in freezer the night before surgery)   1 Hibiclens skin cleaner   Pre-op Instructions  If you have any questions regarding the instructions, do not hesitate to call our office.  [place]: [address] [place], [place] [address] [phone]  [place]: [address] [place], [place] [address] [phone]  [place]: [address]  [place], [place] [address] [phone]  Dr. Richard   Tuchman DPM, Dr. Norman Regal DPM Dr. Richard Sikora DPM, Dr. M. Todd Hyatt DPM, Dr. Kathryn Egerton DPM 

## 2015-04-08 NOTE — Progress Notes (Signed)
   Subjective:    Patient ID: Christine Lloyd, female    DOB: 08/02/1970, 44 y.o.   MRN: [id number]  HPI: She presents today chief complaint of a painful third digit of the right foot. States this is been going on for years and seems to be worsening. Shoe gear is severely painful and particularly with the shoes that she has to wear for work. She has some pain to the fourth digit of the right foot but to a lesser degree. She states is becoming harder to perform her daily activities and continue to maintain her exercise regimen. She would like to see if there is any way that we can fix this so she can continue to work.    Review of Systems  Respiratory: Positive for cough.   Psychiatric/Behavioral: The patient is nervous/anxious.        Objective:   Physical Exam: 44 year old female very pleasant in good humor no apparent distress. Vital signs stable alert and oriented 3 pulses are strongly palpable bilateral. Neurologic sensorium is intact per Semmes-Weinstein monofilament. Deep tendon reflexes are intact bilateral muscle strength +5 over 5 dorsiflexion plantar flexors and inverters everters all into the musculature is intact. Orthopedic evaluation demonstrates all joints distal to the ankle level full range of motion without crepitation. Cutaneous evaluation demonstrates supple well-hydrated cutis no erythema edema saline as drainage or odor open wounds. She may have some edema overlying the fourth and third digits of the right foot particularly over the PIPJ of the third right with hammertoe deformities present. The hammertoe deformity to the third digit of the right foot is rigid in nature and has resulted in a painful reactive hyperkeratosis or corn overlying the PIPJ third right. There is postinflammatory hyperpigmentation in this area but no signs of infection. Radiographs 3 views confirm hammertoe deformity and osteoarthritic changes.        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  Plan: We discussed the etiology pathology conservative versus surgical therapies. At this point she would like to consider surgical intervention to assist in allowing this toe to decompress into become less painful. We consented her for an arthroplasty of the PIPJ with a silicone implant. I discussed this in great detail with her today. We discussed the etiology pathology conservative versus surgical therapies. We went over the consent form line by line number by number giving her ample time to ask questions she sought that regarding this procedure. I answered her questions regarding this procedure to the best of my ability in layman's terms. We also discussed the possible postop complications which may include but are not limited to postop pain bleeding swelling infection recurrence need for further surgery also digit loss of limb loss of life. She was dispensed a Darco shoe today and I will follow-up with her in the near future for surgical intervention. She will call us with questions or concerns that she may have regarding these procedures and we will be more than happy to answer them.  Christine Lloyd DPM

## 2015-04-29 ENCOUNTER — Telehealth: Payer: Self-pay | Admitting: *Deleted

## 2015-04-29 NOTE — Telephone Encounter (Signed)
"  I need to know the date that you signed me up for surgery."  You're scheduled for January 27th.  "Can I get an estimate?  I don't want it right now.  My insurance is going to change at the beginning of the year.  So, I want to know it then."  Bring your new card over about 2 weeks prior to surgery to be checked for pre-certification.  We can assist you with the estimate then.  "Where will my surgery be performed, [place]?  My doctor will perform the surgery correct?"  [facility].  Yes, Dr. Al CorpusHyatt will be performing your surgery.

## 2015-06-01 ENCOUNTER — Telehealth: Payer: Self-pay | Admitting: *Deleted

## 2015-06-02 NOTE — Telephone Encounter (Signed)
"  I have a scheduled surgery in January.  Calling to get information and preparing for the surgery.  Do I need to speak to the doctor again before surgery?"

## 2015-06-02 NOTE — Telephone Encounter (Signed)
"  I'm scheduled for surgery on 07/23/2015 with Dr. Al CorpusHyatt.  I need to find out if I need to do anything else prior to surgery, as far as setting up payment arrangements.  Give me a call back.

## 2015-06-03 NOTE — Telephone Encounter (Signed)
I'm returning your call.  We will send you a statement once surgery has been filed.  You do not have to pay anything up front.  "Is there anything else I need to do prior to surgery?"  You need to register with the surgical center.  Instructions on how to do that are in your brochure.  "He had mentioned doing surgery on my bunion as well as my toe."  He only has you scheduled for a Hammer Toe.  If you are considering having the bunion done as well, you will need to schedule another appointment to see him.  "Okay, I'll call back to get it scheduled to see him and I'll bring my new insurance card when I come."

## 2015-07-13 ENCOUNTER — Encounter: Payer: Self-pay | Admitting: Podiatry

## 2015-07-13 ENCOUNTER — Ambulatory Visit (INDEPENDENT_AMBULATORY_CARE_PROVIDER_SITE_OTHER): Payer: 59 | Admitting: Podiatry

## 2015-07-13 VITALS — BP 118/77 | HR 98 | Resp 16

## 2015-07-13 DIAGNOSIS — M2041 Other hammer toe(s) (acquired), right foot: Secondary | ICD-10-CM | POA: Diagnosis not present

## 2015-07-13 NOTE — Progress Notes (Signed)
She presents today after conversation that we had last time when she was in. We had discussed performing a bunion repair as well as repairing hammertoe #4 of the right foot. Currently she has consented only for hammertoe #3. At this point she would like to consider having the bunion corrected with toes #3 and #4 corrected as well.  Objective: No change in her past medical history medications allergies or surgery. No change in her physical findings.  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 Hammertoe deformities #3 and number for the right foot with a reactive hyperkeratosis overlying the PIPJ dorsal aspect third toe right foot.  Plan: We simply added after a thorough discussion and [place] bunion repair and a hammertoe repair fourth right to the consent form. We went over the possibilities and the concerns that are associated with these procedures. She understands this and is amenable to it and understands that this will take her out of work just as long as a Loss adjuster, chartered would have. Approximately 8 weeks. She understands this and is amenable to it. She signed all 3 cages the consent form and I will follow-up with her in the near future. We dispensed a cam walker as well as paperwork for surgery center.

## 2015-07-13 NOTE — Patient Instructions (Signed)
Pre-Operative Instructions  Congratulations, you have decided to take an important step to improving your quality of life.  You can be assured that the doctors of [facility] will be with you every step of the way.  1. Plan to be at the surgery center/hospital at least 1 (one) hour prior to your scheduled time unless otherwise directed by the surgical center/hospital staff.  You must have a responsible adult accompany you, remain during the surgery and drive you home.  Make sure you have directions to the surgical center/hospital and know how to get there on time. 2. For hospital based surgery you will need to obtain a history and physical form from your family physician within 1 month prior to the date of surgery- we will give you a form for you primary physician.  3. We make every effort to accommodate the date you request for surgery.  There are however, times where surgery dates or times have to be moved.  We will contact you as soon as possible if a change in schedule is required.   4. No Aspirin/Ibuprofen for one week before surgery.  If you are on aspirin, any non-steroidal anti-inflammatory medications (Mobic, Aleve, Ibuprofen) you should stop taking it 7 days prior to your surgery.  You make take Tylenol  For pain prior to surgery.  5. Medications- If you are taking daily heart and blood pressure medications, seizure, reflux, allergy, asthma, anxiety, pain or diabetes medications, make sure the surgery center/hospital is aware before the day of surgery so they may notify you which medications to take or avoid the day of surgery. 6. No food or drink after midnight the night before surgery unless directed otherwise by surgical center/hospital staff. 7. No alcoholic beverages 24 hours prior to surgery.  No smoking 24 hours prior to or 24 hours after surgery. 8. Wear loose pants or shorts- loose enough to fit over bandages, boots, and casts. 9. No slip on shoes, sneakers are best. 10. Bring  your boot with you to the surgery center/hospital.  Also bring crutches or a walker if your physician has prescribed it for you.  If you do not have this equipment, it will be provided for you after surgery. 11. If you have not been contracted by the surgery center/hospital by the day before your surgery, call to confirm the date and time of your surgery. 12. Leave-time from work may vary depending on the type of surgery you have.  Appropriate arrangements should be made prior to surgery with your employer. 13. Prescriptions will be provided immediately following surgery by your doctor.  Have these filled as soon as possible after surgery and take the medication as directed. 14. Remove nail polish on the operative foot. 15. Wash the night before surgery.  The night before surgery wash the foot and leg well with the antibacterial soap provided and water paying special attention to beneath the toenails and in between the toes.  Rinse thoroughly with water and dry well with a towel.  Perform this wash unless told not to do so by your physician.  Enclosed: 1 Ice pack (please put in freezer the night before surgery)   1 Hibiclens skin cleaner   Pre-op Instructions  If you have any questions regarding the instructions, do not hesitate to call our office.  [place]: [address] [place], [place] [address] [phone]  [place]: [address] , [place] [address] [phone]  [place]: [address]  [place], [place] [address] [phone]  Dr. Richard   Tuchman DPM, Dr. Norman Regal DPM Dr. Richard Sikora DPM, Dr. M. Todd Hyatt DPM, Dr. Kathryn Egerton DPM 

## 2015-07-14 ENCOUNTER — Telehealth: Payer: Self-pay | Admitting: *Deleted

## 2015-07-14 NOTE — Telephone Encounter (Signed)
Authorization was obtained from [facility] for cpt codes 09811 and 28285, scheduled for 07/23/2015.  Authorization code is [id number].  It was faxed to [facility] at [facility].

## 2015-07-21 ENCOUNTER — Other Ambulatory Visit: Payer: Self-pay | Admitting: Podiatry

## 2015-07-21 MED ORDER — OXYCODONE-ACETAMINOPHEN 10-325 MG PO TABS: ORAL_TABLET | ORAL | Status: DC

## 2015-07-21 MED ORDER — CEPHALEXIN 500 MG PO CAPS: 500.0000 mg | ORAL_CAPSULE | Freq: Three times a day (TID) | ORAL | Status: DC

## 2015-07-21 MED ORDER — PROMETHAZINE HCL 25 MG PO TABS: 25.0000 mg | ORAL_TABLET | Freq: Three times a day (TID) | ORAL | Status: DC | PRN

## 2015-07-23 ENCOUNTER — Telehealth: Payer: Self-pay | Admitting: *Deleted

## 2015-07-23 DIAGNOSIS — M2041 Other hammer toe(s) (acquired), right foot: Secondary | ICD-10-CM | POA: Diagnosis not present

## 2015-07-23 DIAGNOSIS — M2011 Hallux valgus (acquired), right foot: Secondary | ICD-10-CM | POA: Diagnosis not present

## 2015-07-23 NOTE — Telephone Encounter (Signed)
"  Looking to confirm patient had Bunionectomy today."

## 2015-07-26 ENCOUNTER — Telehealth: Payer: Self-pay | Admitting: *Deleted

## 2015-07-26 NOTE — Telephone Encounter (Signed)
Pt states she needs a lower dose of the pain medication it is making her throw up.  I asked pt if she was taking the anti-nausea medication, and she said no.  I told her to take the promethazine now and the pain medication 15-30 minutes later and it should help and to eat before taking the pain medication it would help.  Pt laughed and agreed.

## 2015-07-29 ENCOUNTER — Ambulatory Visit (INDEPENDENT_AMBULATORY_CARE_PROVIDER_SITE_OTHER): Payer: 59

## 2015-07-29 ENCOUNTER — Encounter: Payer: Self-pay | Admitting: Podiatry

## 2015-07-29 ENCOUNTER — Ambulatory Visit (INDEPENDENT_AMBULATORY_CARE_PROVIDER_SITE_OTHER): Payer: 59 | Admitting: Podiatry

## 2015-07-29 VITALS — BP 122/80 | HR 96 | Resp 12

## 2015-07-29 DIAGNOSIS — M2041 Other hammer toe(s) (acquired), right foot: Secondary | ICD-10-CM

## 2015-07-29 DIAGNOSIS — Z9889 Other specified postprocedural states: Secondary | ICD-10-CM

## 2015-07-29 MED ORDER — OXYCODONE-ACETAMINOPHEN 10-325 MG PO TABS: ORAL_TABLET | ORAL | Status: DC

## 2015-07-30 ENCOUNTER — Other Ambulatory Visit: Payer: Self-pay

## 2015-08-01 NOTE — Progress Notes (Signed)
She presents today 1 week status post hammertoe repair with silicone implant third digit right foot. Date of surgery 07/23/2015. She denies fever chills nausea vomiting muscle aches and pains.  Objective: Vital signs are stable she is alert and oriented 3. Dry sterile dressing intact was removed in stress though erythema mild edema no cellulitis drainage or odor sutures are intact and margins are well coapted.  Assessment: Well-healing surgical toe.  Plan: Redressed today dry sterile compressive dressing follow-up with her in 1 week.

## 2015-08-05 ENCOUNTER — Encounter: Payer: Self-pay | Admitting: Podiatry

## 2015-08-07 ENCOUNTER — Ambulatory Visit (INDEPENDENT_AMBULATORY_CARE_PROVIDER_SITE_OTHER): Payer: 59 | Admitting: Family Medicine

## 2015-08-07 VITALS — BP 108/72 | HR 83 | Temp 98.4°F | Resp 16 | Ht 62.75 in | Wt 157.0 lb

## 2015-08-07 DIAGNOSIS — N898 Other specified noninflammatory disorders of vagina: Secondary | ICD-10-CM

## 2015-08-07 DIAGNOSIS — B379 Candidiasis, unspecified: Secondary | ICD-10-CM

## 2015-08-07 DIAGNOSIS — L298 Other pruritus: Secondary | ICD-10-CM

## 2015-08-07 LAB — POCT WET + KOH PREP: Trich by wet prep: ABSENT

## 2015-08-07 MED ORDER — FLUCONAZOLE 150 MG PO TABS: 150.0000 mg | ORAL_TABLET | Freq: Once | ORAL | Status: DC

## 2015-08-07 NOTE — Progress Notes (Signed)
[facility] [address], [place] [address] [phone]  Date:  08/07/2015   Name:  Christine Lloyd   DOB:  07/27/1970   MRN:  [id number]  PCP:  Georgann Housekeeper, MD    Chief Complaint: vaginal discomfort and vaginal itching   History of Present Illness:  Christine Lloyd is a 45 y.o. very pleasant female patient who presents with the following:  History of HTN, depression.  She has noted vaginal itching for 4-5 days. She recnetly had foot surgery and was treated with keflex She has not noted any odor, she has noted a slight discharge.  She has not used any treatment, just a cold towel No urinary sx LMP just about one month ago No new sexual partners No belly pain, vomiting, or fever  There are no active problems to display for this patient.   Past Medical History  Diagnosis Date  . Allergy   . Hypertension   . Anxiety   . Depression     Past Surgical History  Procedure Laterality Date  . Cosmetic surgery    . Breast surgery    . Cesarean section    . Bunion/hammer toe surgery      Social History  Substance Use Topics  . Smoking status: Never Smoker   . Smokeless tobacco: None  . Alcohol Use: No    Family History  Problem Relation Age of Onset  . Hypertension Father   . Diabetes Brother   . Diabetes Maternal Aunt   . Diabetes Other     No Known Allergies  Medication list has been reviewed and updated.  Current Outpatient Prescriptions on File Prior to Visit  Medication Sig Dispense Refill  . amLODipine-benazepril (LOTREL) 10-20 MG per capsule Take 1 capsule by mouth daily.    . benazepril-hydrochlorthiazide (LOTENSIN HCT) 10-12.5 MG per tablet Take 1 tablet by mouth daily.    . cephALEXin (KEFLEX) 500 MG capsule Take 1 capsule (500 mg total) by mouth 3 (three) times daily. 30 capsule 0  . hydrochlorothiazide (HYDRODIURIL) 25 MG tablet     . oxyCODONE-acetaminophen (PERCOCET) 10-325 MG tablet Take one to two tablets by  mouth every six to eight hours as needed for pain. 40 tablet 0  . promethazine (PHENERGAN) 25 MG tablet Take 1 tablet (25 mg total) by mouth every 8 (eight) hours as needed for nausea or vomiting. 20 tablet 0   No current facility-administered medications on file prior to visit.    Review of Systems:  As per HPI- otherwise negative.   Physical Examination: Filed Vitals:   08/07/15 1028  BP: 108/72  Pulse: 83  Temp: 98.4 F (36.9 C)  Resp: 16   Filed Vitals:   08/07/15 1028  Height: 5' 2.75" (1.594 m)  Weight: 157 lb (71.215 kg)   Body mass index is 28.03 kg/(m^2). Ideal Body Weight: Weight in (lb) to have BMI = 25: 139.7  GEN: WDWN, NAD, Non-toxic, A & O x 3, looks well HEENT: Atraumatic, Normocephalic. Neck supple. No masses, No LAD. Ears and Nose: No external deformity. CV: RRR, No M/G/R. No JVD. No thrill. No extra heart sounds. PULM: CTA B, no wheezes, crackles, rhonchi. No retractions. No resp. distress. No accessory muscle use. ABD: S, NT, ND EXTR: No c/c/e. Recent right foot surgery, wearing a boot PSYCH: Normally interactive. Conversant. Not depressed or anxious appearing.  Calm demeanor.  Vulva is slightly inflamed with a thin white discharge  Results for orders placed or  performed in visit on 08/07/15  POCT Wet + KOH Prep  Result Value Ref Range   Yeast by KOH Present Present, Absent   Yeast by wet prep Present Present, Absent   WBC by wet prep Moderate (A) None, Few, Too numerous to count   Clue Cells Wet Prep HPF POC None None, Too numerous to count   Trich by wet prep Absent Present, Absent   Bacteria Wet Prep HPF POC Moderate (A) None, Few, Too numerous to count   Epithelial Cells By [employer] ([facility]) Moderate (A) None, Few, Too numerous to count   RBC,UR,HPF,POC None None RBC/hpf     Assessment and Plan: Monilia infection - Plan: fluconazole (DIFLUCAN) 150 MG tablet  Vaginal itching - Plan: POCT Wet + KOH Prep  Vaginal yeast infection- treat with  diflucan.  She will follow-up if not better soon  Signed Abbe Amsterdam, MD

## 2015-08-07 NOTE — Patient Instructions (Signed)
You do have a vaginal yeast infection Take a diflucan pill and repeat in one week if needed Let me know if this does not resolve your symptoms

## 2015-08-10 ENCOUNTER — Ambulatory Visit (INDEPENDENT_AMBULATORY_CARE_PROVIDER_SITE_OTHER): Payer: 59 | Admitting: Podiatry

## 2015-08-10 ENCOUNTER — Encounter: Payer: Self-pay | Admitting: Podiatry

## 2015-08-10 VITALS — BP 119/77 | HR 104 | Resp 12

## 2015-08-10 DIAGNOSIS — Z9889 Other specified postprocedural states: Secondary | ICD-10-CM

## 2015-08-10 DIAGNOSIS — M2041 Other hammer toe(s) (acquired), right foot: Secondary | ICD-10-CM | POA: Diagnosis not present

## 2015-08-10 NOTE — Progress Notes (Signed)
She presents today for postop visit status post [facility] bunion repair right hammertoe repair #3 and #4 digits right foot. Date of surgery 07/23/2015. She states that she is doing much better.  Objective: Vital signs are stable she is alert and oriented 3. Dry sterile dressing intact was removed demonstrates well coapted margins good range of motion of the first metatarsophalangeal joint much decrease in edema from previous visit. Her toes appear to be healing well sutures were removed today and margins remain well coapted.  Assessment: Well-healing surgical foot right.  Plan: Return a compression anklet today and Darco shoe I expect her to start washing this foot and applying lotion to the foot I will follow-up with her in 2 weeks. She would like to remain out of work until this is completely healed I fully agree with her based on her job duties.

## 2015-08-24 ENCOUNTER — Encounter: Payer: Self-pay | Admitting: Podiatry

## 2015-08-24 ENCOUNTER — Ambulatory Visit (INDEPENDENT_AMBULATORY_CARE_PROVIDER_SITE_OTHER): Payer: 59

## 2015-08-24 ENCOUNTER — Ambulatory Visit (INDEPENDENT_AMBULATORY_CARE_PROVIDER_SITE_OTHER): Payer: 59 | Admitting: Podiatry

## 2015-08-24 VITALS — BP 114/70 | HR 90 | Resp 12

## 2015-08-24 DIAGNOSIS — Z9889 Other specified postprocedural states: Secondary | ICD-10-CM

## 2015-08-24 DIAGNOSIS — M2041 Other hammer toe(s) (acquired), right foot: Secondary | ICD-10-CM | POA: Diagnosis not present

## 2015-08-24 NOTE — Progress Notes (Signed)
She presents today status post [place] bunion repair right foot and hammertoe repair arthroplasty third and fourth digits of the right foot. She states that she is doing very well although it still swells and is a little sore.  Objective: Vital signs are stable she is alert and oriented 3 presents today in her Darco shoe. Mild edema no erythema cellulitis drainage or odor all incisions are gone to heal completely. She has a great range of motion of the first metatarsophalangeal joint without pain. Radiographs confirm well-healing osteotomy with screw fixation.  Assessment: Well-healing surgical foot right.  Plan: I would encourage her to be out of work for another 3-4 weeks while we assess her ability to ambulate in regular shoe gear. I will follow up with her in 3 weeks.

## 2015-08-31 ENCOUNTER — Other Ambulatory Visit: Payer: Self-pay

## 2015-08-31 DIAGNOSIS — Z1231 Encounter for screening mammogram for malignant neoplasm of breast: Secondary | ICD-10-CM

## 2015-09-14 ENCOUNTER — Ambulatory Visit (INDEPENDENT_AMBULATORY_CARE_PROVIDER_SITE_OTHER): Payer: 59 | Admitting: Podiatry

## 2015-09-14 ENCOUNTER — Ambulatory Visit (INDEPENDENT_AMBULATORY_CARE_PROVIDER_SITE_OTHER): Payer: 59

## 2015-09-14 ENCOUNTER — Encounter: Payer: Self-pay | Admitting: Podiatry

## 2015-09-14 VITALS — BP 143/92 | HR 90 | Resp 12

## 2015-09-14 DIAGNOSIS — M2041 Other hammer toe(s) (acquired), right foot: Secondary | ICD-10-CM

## 2015-09-14 DIAGNOSIS — Z9889 Other specified postprocedural states: Secondary | ICD-10-CM

## 2015-09-14 NOTE — Progress Notes (Signed)
She presents today for a follow-up of her [facility] bunion repair right second metatarsophalangeal joint release right hammertoe repair arthroplasty toes #2 #3 on the right foot. She states that she is doing very well and the pain is diminished considerably. She states that she has a good range of motion but she still gets swelling by the end of the day that is exquisitely painful.  Objective: Vital signs are stable she is alert and oriented 3 she has great range of motion. She has some thick scars which are mildly tender. Radiographs confirm well-healing surgical foot.  Assessment: 1 on surgical foot thickened scars.  Plan: I encouraged her to massage the scars to help break up the deep scar tissue so that she'll have a better emptying of her lymphatics. I offered sending her to physical therapy but she declined. She was to go back to work next week I see no problem with this her bones appear to be healed nearly 100%. I will follow-up with her 1 month if necessary.

## 2015-09-17 ENCOUNTER — Ambulatory Visit
Admission: RE | Admit: 2015-09-17 | Discharge: 2015-09-17 | Disposition: A | Discharge status: Home / Self Care | Payer: 59 | Source: Ambulatory Visit

## 2015-09-17 DIAGNOSIS — Z1231 Encounter for screening mammogram for malignant neoplasm of breast: Secondary | ICD-10-CM

## 2015-10-12 ENCOUNTER — Encounter: Payer: 59 | Admitting: Podiatry

## 2016-01-03 ENCOUNTER — Other Ambulatory Visit: Payer: Self-pay | Admitting: Family Medicine

## 2016-01-04 NOTE — Telephone Encounter (Signed)
Called patient to inform we received call from pharmacy. States she is coming  for recurring itching.

## 2016-01-07 ENCOUNTER — Ambulatory Visit: Payer: 59

## 2016-08-24 DIAGNOSIS — Z01411 Encounter for gynecological examination (general) (routine) with abnormal findings: Secondary | ICD-10-CM | POA: Diagnosis not present

## 2016-08-28 ENCOUNTER — Other Ambulatory Visit: Payer: Self-pay | Admitting: Obstetrics and Gynecology

## 2016-08-28 ENCOUNTER — Other Ambulatory Visit: Payer: Self-pay | Admitting: Podiatry

## 2016-08-28 DIAGNOSIS — Z1231 Encounter for screening mammogram for malignant neoplasm of breast: Secondary | ICD-10-CM

## 2016-08-28 DIAGNOSIS — Z0000 Encounter for general adult medical examination without abnormal findings: Secondary | ICD-10-CM | POA: Diagnosis not present

## 2016-09-04 DIAGNOSIS — Z1389 Encounter for screening for other disorder: Secondary | ICD-10-CM | POA: Diagnosis not present

## 2016-09-04 DIAGNOSIS — E559 Vitamin D deficiency, unspecified: Secondary | ICD-10-CM | POA: Diagnosis not present

## 2016-09-04 DIAGNOSIS — E7800 Pure hypercholesterolemia, unspecified: Secondary | ICD-10-CM | POA: Diagnosis not present

## 2016-09-04 DIAGNOSIS — Z0000 Encounter for general adult medical examination without abnormal findings: Secondary | ICD-10-CM | POA: Diagnosis not present

## 2016-09-04 DIAGNOSIS — D72819 Decreased white blood cell count, unspecified: Secondary | ICD-10-CM | POA: Diagnosis not present

## 2016-09-05 DIAGNOSIS — N771 Vaginitis, vulvitis and vulvovaginitis in diseases classified elsewhere: Secondary | ICD-10-CM | POA: Diagnosis not present

## 2016-09-05 DIAGNOSIS — Z7689 Persons encountering health services in other specified circumstances: Secondary | ICD-10-CM | POA: Diagnosis not present

## 2016-09-18 ENCOUNTER — Ambulatory Visit: Payer: 59

## 2016-10-16 ENCOUNTER — Ambulatory Visit
Admission: RE | Admit: 2016-10-16 | Discharge: 2016-10-16 | Disposition: A | Discharge status: Home / Self Care | Payer: 59 | Source: Ambulatory Visit | Attending: Obstetrics and Gynecology | Admitting: Obstetrics and Gynecology

## 2016-10-16 DIAGNOSIS — Z719 Counseling, unspecified: Secondary | ICD-10-CM | POA: Diagnosis not present

## 2016-10-16 DIAGNOSIS — Z1231 Encounter for screening mammogram for malignant neoplasm of breast: Secondary | ICD-10-CM | POA: Diagnosis not present

## 2016-10-30 DIAGNOSIS — Z719 Counseling, unspecified: Secondary | ICD-10-CM | POA: Diagnosis not present

## 2016-11-06 DIAGNOSIS — Z719 Counseling, unspecified: Secondary | ICD-10-CM | POA: Diagnosis not present

## 2016-11-27 DIAGNOSIS — Z719 Counseling, unspecified: Secondary | ICD-10-CM | POA: Diagnosis not present

## 2016-12-04 DIAGNOSIS — Z719 Counseling, unspecified: Secondary | ICD-10-CM | POA: Diagnosis not present

## 2016-12-11 DIAGNOSIS — Z719 Counseling, unspecified: Secondary | ICD-10-CM | POA: Diagnosis not present

## 2016-12-18 DIAGNOSIS — Z719 Counseling, unspecified: Secondary | ICD-10-CM | POA: Diagnosis not present

## 2017-01-01 DIAGNOSIS — Z719 Counseling, unspecified: Secondary | ICD-10-CM | POA: Diagnosis not present

## 2017-01-22 DIAGNOSIS — Z719 Counseling, unspecified: Secondary | ICD-10-CM | POA: Diagnosis not present

## 2017-01-29 DIAGNOSIS — Z719 Counseling, unspecified: Secondary | ICD-10-CM | POA: Diagnosis not present

## 2017-02-12 DIAGNOSIS — Z719 Counseling, unspecified: Secondary | ICD-10-CM | POA: Diagnosis not present

## 2017-02-19 DIAGNOSIS — Z719 Counseling, unspecified: Secondary | ICD-10-CM | POA: Diagnosis not present

## 2017-04-24 DIAGNOSIS — H35033 Hypertensive retinopathy, bilateral: Secondary | ICD-10-CM | POA: Diagnosis not present

## 2017-07-06 IMAGING — MG 2D DIGITAL SCREENING BILATERAL MAMMOGRAM WITH CAD AND ADJUNCT TO
8 of 12 series · 8 of 28 positions shown · non-contrast
Comparison: Previous exam(s).

CLINICAL DATA: Screening.

EXAM:
2D DIGITAL SCREENING BILATERAL MAMMOGRAM WITH CAD AND ADJUNCT TOMO

[L CC synth-2D]
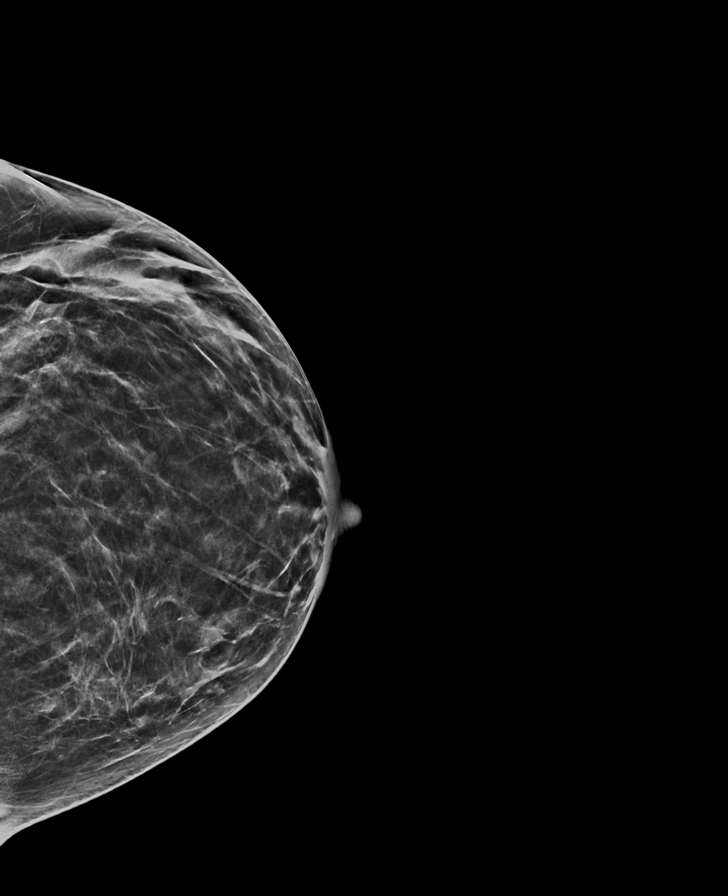

[R MLO synth-2D]
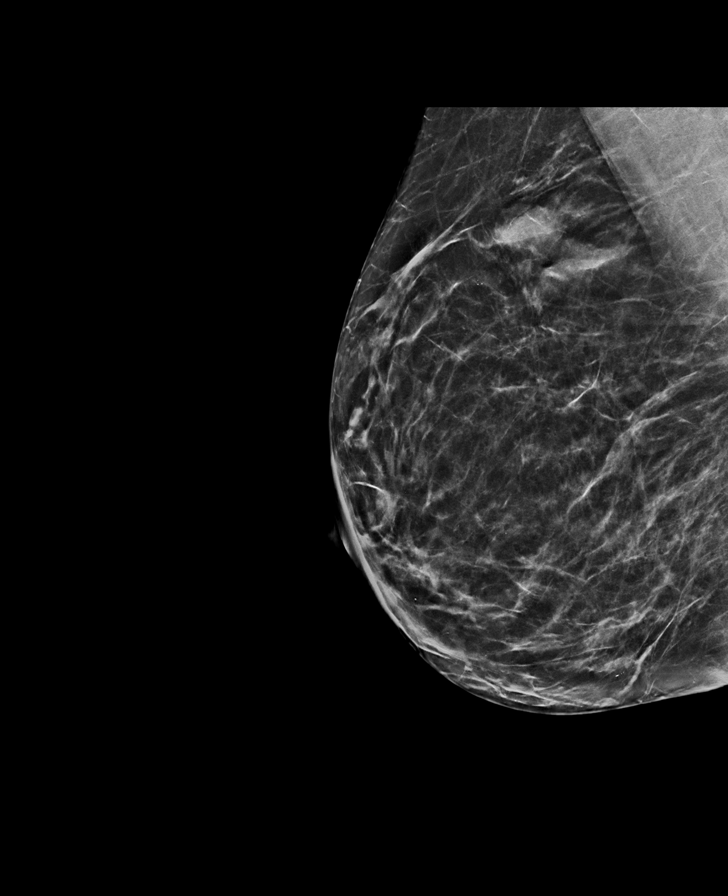

[L MLO]
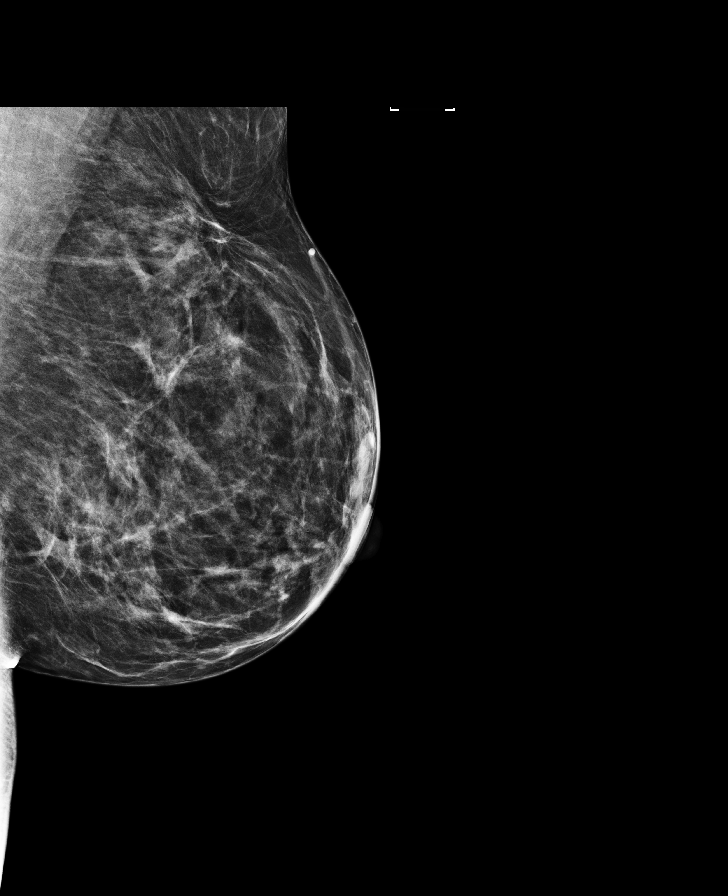

[R CC]
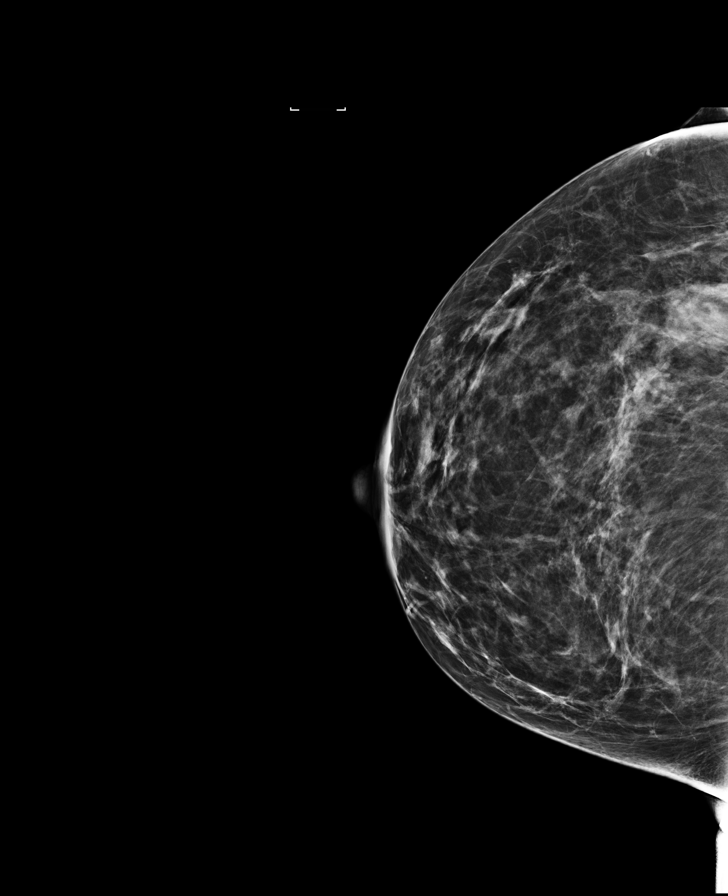

[R MLO]
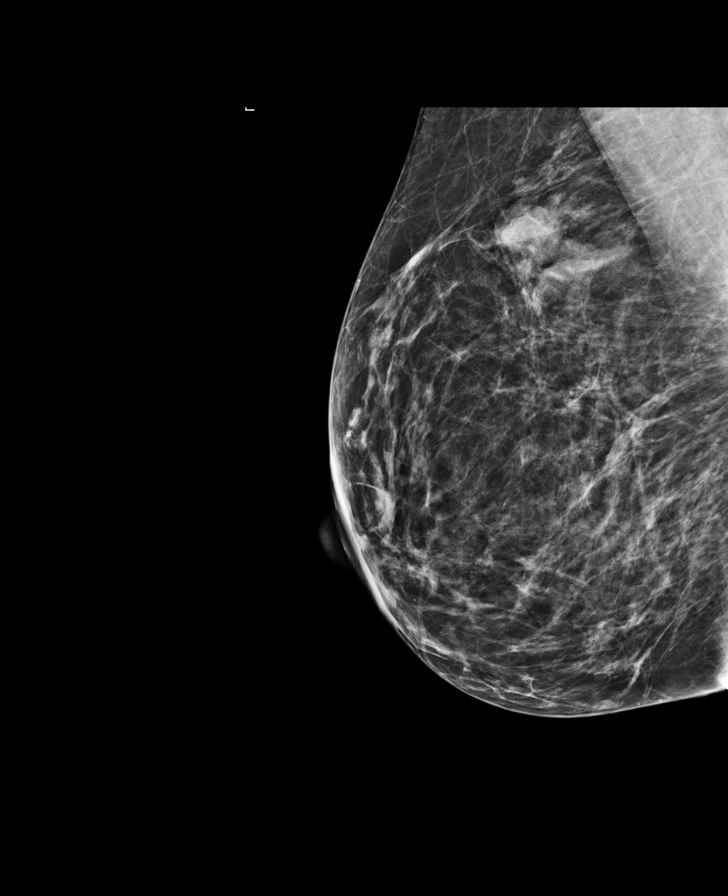

[R CC synth-2D]
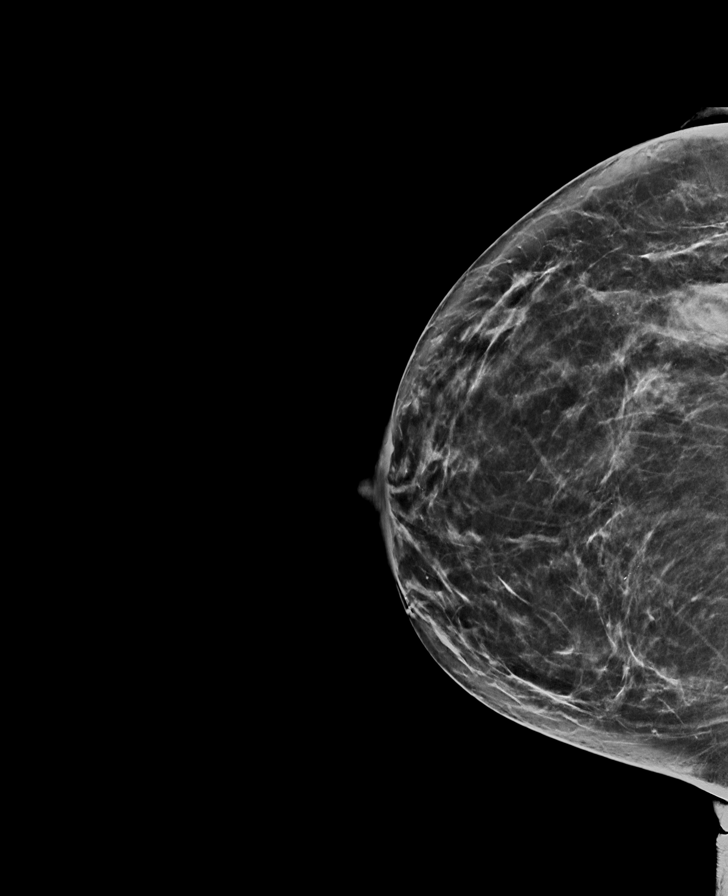

[L MLO synth-2D]
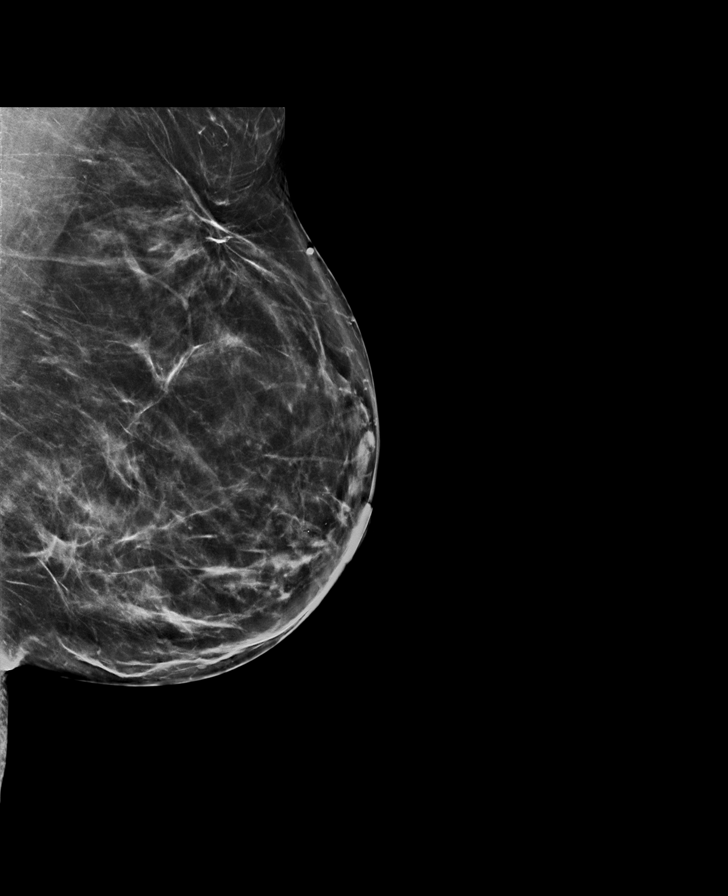

[L CC]
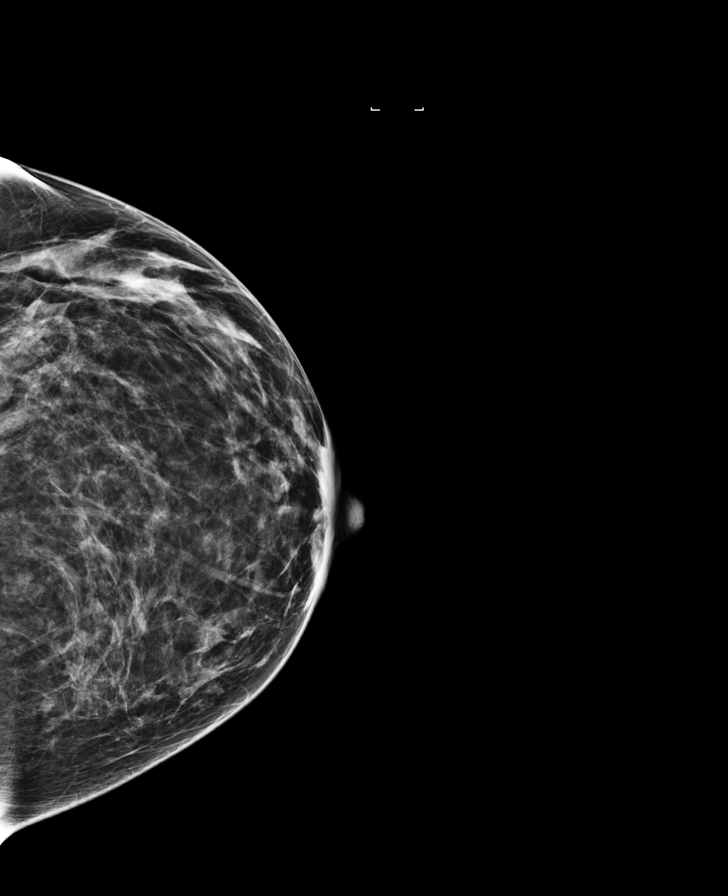

[8 of 28 positions shown; findings below may reference images not displayed]

ACR Breast Density Category b: There are scattered areas of
fibroglandular density.
FINDINGS: There are no findings suspicious for malignancy. Images were
processed with CAD.
IMPRESSION: No mammographic evidence of malignancy. A result letter of this
screening mammogram will be mailed directly to the patient.

RECOMMENDATION:
Screening mammogram in one year. (Code:97-6-RS4)

BI-RADS CATEGORY  1: Negative.

## 2017-08-30 DIAGNOSIS — Z0000 Encounter for general adult medical examination without abnormal findings: Secondary | ICD-10-CM | POA: Diagnosis not present

## 2017-08-30 DIAGNOSIS — R82998 Other abnormal findings in urine: Secondary | ICD-10-CM | POA: Diagnosis not present

## 2017-09-06 DIAGNOSIS — Z1389 Encounter for screening for other disorder: Secondary | ICD-10-CM | POA: Diagnosis not present

## 2017-09-06 DIAGNOSIS — Z0000 Encounter for general adult medical examination without abnormal findings: Secondary | ICD-10-CM | POA: Diagnosis not present

## 2017-09-06 DIAGNOSIS — E7800 Pure hypercholesterolemia, unspecified: Secondary | ICD-10-CM | POA: Diagnosis not present

## 2017-09-06 DIAGNOSIS — E559 Vitamin D deficiency, unspecified: Secondary | ICD-10-CM | POA: Diagnosis not present

## 2017-10-28 DIAGNOSIS — N771 Vaginitis, vulvitis and vulvovaginitis in diseases classified elsewhere: Secondary | ICD-10-CM | POA: Diagnosis not present

## 2017-10-28 DIAGNOSIS — Z7689 Persons encountering health services in other specified circumstances: Secondary | ICD-10-CM | POA: Diagnosis not present

## 2017-12-11 ENCOUNTER — Other Ambulatory Visit: Payer: Self-pay | Admitting: Obstetrics and Gynecology

## 2017-12-11 DIAGNOSIS — Z719 Counseling, unspecified: Secondary | ICD-10-CM | POA: Diagnosis not present

## 2017-12-11 DIAGNOSIS — Z1231 Encounter for screening mammogram for malignant neoplasm of breast: Secondary | ICD-10-CM

## 2018-01-02 DIAGNOSIS — Z719 Counseling, unspecified: Secondary | ICD-10-CM | POA: Diagnosis not present

## 2018-01-09 DIAGNOSIS — Z719 Counseling, unspecified: Secondary | ICD-10-CM | POA: Diagnosis not present

## 2018-01-14 ENCOUNTER — Ambulatory Visit: Payer: 59

## 2018-01-16 DIAGNOSIS — Z719 Counseling, unspecified: Secondary | ICD-10-CM | POA: Diagnosis not present

## 2018-01-24 ENCOUNTER — Other Ambulatory Visit: Payer: Self-pay | Admitting: Obstetrics and Gynecology

## 2018-01-24 ENCOUNTER — Other Ambulatory Visit (HOSPITAL_COMMUNITY)
Admission: RE | Admit: 2018-01-24 | Discharge: 2018-01-24 | Disposition: A | Discharge status: Home / Self Care | Payer: 59 | Source: Ambulatory Visit | Attending: Obstetrics and Gynecology | Admitting: Obstetrics and Gynecology

## 2018-01-24 DIAGNOSIS — Z01411 Encounter for gynecological examination (general) (routine) with abnormal findings: Secondary | ICD-10-CM | POA: Diagnosis not present

## 2018-01-24 DIAGNOSIS — N9489 Other specified conditions associated with female genital organs and menstrual cycle: Secondary | ICD-10-CM | POA: Diagnosis not present

## 2018-01-28 LAB — CYTOLOGY - PAP
Diagnosis: NEGATIVE
HPV (WINDOPATH): NOT DETECTED

## 2018-03-21 DIAGNOSIS — E559 Vitamin D deficiency, unspecified: Secondary | ICD-10-CM | POA: Diagnosis not present

## 2018-03-21 DIAGNOSIS — I10 Essential (primary) hypertension: Secondary | ICD-10-CM | POA: Diagnosis not present

## 2018-10-03 DIAGNOSIS — Z0000 Encounter for general adult medical examination without abnormal findings: Secondary | ICD-10-CM | POA: Diagnosis not present

## 2018-10-07 DIAGNOSIS — R82998 Other abnormal findings in urine: Secondary | ICD-10-CM | POA: Diagnosis not present

## 2018-10-07 DIAGNOSIS — I10 Essential (primary) hypertension: Secondary | ICD-10-CM | POA: Diagnosis not present

## 2018-10-09 DIAGNOSIS — Z0000 Encounter for general adult medical examination without abnormal findings: Secondary | ICD-10-CM | POA: Diagnosis not present

## 2018-11-14 ENCOUNTER — Other Ambulatory Visit: Payer: Self-pay

## 2018-11-14 ENCOUNTER — Ambulatory Visit (INDEPENDENT_AMBULATORY_CARE_PROVIDER_SITE_OTHER): Payer: 59

## 2018-11-14 ENCOUNTER — Encounter: Payer: Self-pay | Admitting: Podiatry

## 2018-11-14 ENCOUNTER — Ambulatory Visit: Payer: 59 | Admitting: Podiatry

## 2018-11-14 VITALS — BP 133/82 | HR 100 | Temp 97.5°F | Resp 16

## 2018-11-14 DIAGNOSIS — M722 Plantar fascial fibromatosis: Secondary | ICD-10-CM

## 2018-11-14 DIAGNOSIS — M7751 Other enthesopathy of right foot: Secondary | ICD-10-CM | POA: Diagnosis not present

## 2018-11-14 MED ORDER — METHYLPREDNISOLONE 4 MG PO TBPK: ORAL_TABLET | ORAL | 0 refills | Status: DC

## 2018-11-14 NOTE — Patient Instructions (Addendum)
Pre-Operative Instructions  Congratulations, you have decided to take an important step towards improving your quality of life.  You can be assured that the doctors and staff at [facility] will be with you every step of the way.  Here are some important things you should know:  1. Plan to be at the surgery center/hospital at least 1 (one) hour prior to your scheduled time, unless otherwise directed by the surgical center/hospital staff.  You must have a responsible adult accompany you, remain during the surgery and drive you home.  Make sure you have directions to the surgical center/hospital to ensure you arrive on time. 2. If you are having surgery at [facility] or [facility] hospitals, you will need a copy of your medical history and physical form from your family physician within one month prior to the date of surgery. We will give you a form for your primary physician to complete.  3. We make every effort to accommodate the date you request for surgery.  However, there are times where surgery dates or times have to be moved.  We will contact you as soon as possible if a change in schedule is required.   4. No aspirin/ibuprofen for one week before surgery.  If you are on aspirin, any non-steroidal anti-inflammatory medications (Mobic, Aleve, Ibuprofen) should not be taken seven (7) days prior to your surgery.  You make take Tylenol for pain prior to surgery.  5. Medications - If you are taking daily heart and blood pressure medications, seizure, reflux, allergy, asthma, anxiety, pain or diabetes medications, make sure you notify the surgery center/hospital before the day of surgery so they can tell you which medications you should take or avoid the day of surgery. 6. No food or drink after midnight the night before surgery unless directed otherwise by surgical center/hospital staff. 7. No alcoholic beverages 24-hours prior to surgery.  No smoking 24-hours prior or 24-hours after  surgery. 8. Wear loose pants or shorts. They should be loose enough to fit over bandages, boots, and casts. 9. Don't wear slip-on shoes. Sneakers are preferred. 10. Bring your boot with you to the surgery center/hospital.  Also bring crutches or a walker if your physician has prescribed it for you.  If you do not have this equipment, it will be provided for you after surgery. 11. If you have not been contacted by the surgery center/hospital by the day before your surgery, call to confirm the date and time of your surgery. 12. Leave-time from work may vary depending on the type of surgery you have.  Appropriate arrangements should be made prior to surgery with your employer. 13. Prescriptions will be provided immediately following surgery by your doctor.  Fill these as soon as possible after surgery and take the medication as directed. Pain medications will not be refilled on weekends and must be approved by the doctor. 14. Remove nail polish on the operative foot and avoid getting pedicures prior to surgery. 15. Wash the night before surgery.  The night before surgery wash the foot and leg well with water and the antibacterial soap provided. Be sure to pay special attention to beneath the toenails and in between the toes.  Wash for at least three (3) minutes. Rinse thoroughly with water and dry well with a towel.  Perform this wash unless told not to do so by your physician.  Enclosed: 1 Ice pack (please put in freezer the night before surgery)   1 Hibiclens skin cleaner     Pre-op instructions  If you have any questions regarding the instructions, please do not hesitate to call our office.  [place]: [address], [place], [place] [address] -- [phone]  [place]: [address] [place], [place] [address] -- [phone]  [place]: [address]  [place], [place] [address] -- [phone]  [place]: [address], [address], [place], [place] [address] -- [phone]  Website:  [email or web address] 

## 2018-11-14 NOTE — Progress Notes (Signed)
She presents today after having not seen her for about 3 years with a chief complaint of a painful second toe right foot and painful area beneath the second metatarsal phalangeal joint as she points to the area.  She denies any change in her past medical history medications allergies surgery social history.  Denies any trauma to the area.  States that the toe is starting to rub her shoes and becoming more painful.  ROS: Denies fever chills nausea vomiting muscle aches pains calf pain back pain chest pain shortness of breath.  Allergies: None  Objective: Vital signs are stable alert and oriented x3.  She has a plantarflexed second metatarsal this painful on palpation with pain on range of motion of the hammertoe.  The hammertoe deformity is nearly rigid in nature at the level of the PIPJ.  Radiographs taken today demonstrate elongated second metatarsal with cocked up hammertoe deformity.  Muscle strength is normal symmetrical deep tendon reflexes are normal.  Muscle strength is normal.  Skin is normal and well hydrated.  No open lesions or wounds.  Assessment: Hammertoe deformity capsulitis of the second metatarsal phalangeal joint.  Plan: At this point consented her for a second metatarsal osteotomy and hammertoe repair with an arthroplasty of the second toe.  She understands and is amenable to it we will follow-up with me in the near future for surgery.

## 2018-12-02 ENCOUNTER — Telehealth: Payer: Self-pay | Admitting: *Deleted

## 2018-12-02 NOTE — Telephone Encounter (Signed)
"  I was calling to verify my scheduled surgery.  I am completing the forms that I was sent by email."  I am returning your call.  We have you scheduled for December 13, 2018.  "Oh, it's not this Friday.  I just received this email, so I was a little concerned."  What email did you receive?  "It says for me to complete my medication list through [facility] [employer]."  The message was sent from the surgery center.  "Oh, I see it now.  I just got into a panic, thinking it was this Friday.  Thank you so much for calling me back."

## 2018-12-06 ENCOUNTER — Telehealth: Payer: Self-pay | Admitting: *Deleted

## 2018-12-06 NOTE — Telephone Encounter (Signed)
DOS 12/13/2018; CPT CODES: 73578 - METATARSAL OSTEOTOMY 2ND AND 97847 - HAMMER TOE REPAIR 2ND RIGHT FOOT  [employer]: Effective Date - 08/25/2018 - 06/26/2019    Individual In-Network (Calendar Year) Deductible $2.30 MET YTD  $997.70 remaining  $1,000.00 Plan Amt.   Out-of-Pocket $162.90 MET YTD  $4,837.10 remaining  $5,000.00 Plan Amt.   Physician Fees for Surgical and Medical Services 80% of eligible expenses after satisfying $1,000 deductible    The notification/prior authorization case information was transmitted on 12/06/2018 at 3:01 PM CDT. The notification/prior authorization reference number is [id number].

## 2018-12-10 NOTE — Telephone Encounter (Signed)
"  I'm calling to see what time I'm supposed to be there for my surgery on Friday."  Someone from the surgical center will call you a day or two prior to your surgery date, that person will give you your arrival time.  "I thought someone had told me that before but I couldn't remember for sure."

## 2018-12-10 NOTE — Telephone Encounter (Signed)
Surgery was authorized.  Authorization # is [id number]   1-2  Code  Description  Servicing Provider Name, Tax ID, Status, Address  Coverage Status   1 [phone] Correction, hammertoe (eg, interphalange more  [address]  [id number], [place],  [address] [place],  [place], [place] [address] Covered/Approved  2 [phone] Osteotomy, with or without lengthening, more  [facility],  [id number], [place],  [place], [place] [address] Covered/Approved

## 2018-12-11 ENCOUNTER — Other Ambulatory Visit: Payer: Self-pay | Admitting: Podiatry

## 2018-12-11 MED ORDER — OXYCODONE-ACETAMINOPHEN 10-325 MG PO TABS
1.0000 | ORAL_TABLET | Freq: Four times a day (QID) | ORAL | 0 refills | Status: AC | PRN
Start: 2018-12-11 — End: 2018-12-18

## 2018-12-11 MED ORDER — ONDANSETRON HCL 4 MG PO TABS: 4.0000 mg | ORAL_TABLET | Freq: Three times a day (TID) | ORAL | 0 refills | Status: DC | PRN

## 2018-12-11 MED ORDER — CEPHALEXIN 500 MG PO CAPS: 500.0000 mg | ORAL_CAPSULE | Freq: Three times a day (TID) | ORAL | 0 refills | Status: DC

## 2018-12-13 ENCOUNTER — Encounter: Payer: Self-pay | Admitting: Podiatry

## 2018-12-13 DIAGNOSIS — M2041 Other hammer toe(s) (acquired), right foot: Secondary | ICD-10-CM | POA: Diagnosis not present

## 2018-12-13 DIAGNOSIS — M21541 Acquired clubfoot, right foot: Secondary | ICD-10-CM

## 2018-12-19 ENCOUNTER — Encounter: Payer: Self-pay | Admitting: Podiatry

## 2018-12-19 ENCOUNTER — Ambulatory Visit (INDEPENDENT_AMBULATORY_CARE_PROVIDER_SITE_OTHER): Payer: 59

## 2018-12-19 ENCOUNTER — Ambulatory Visit (INDEPENDENT_AMBULATORY_CARE_PROVIDER_SITE_OTHER): Payer: Self-pay | Admitting: Podiatry

## 2018-12-19 ENCOUNTER — Other Ambulatory Visit: Payer: Self-pay

## 2018-12-19 VITALS — Temp 97.7°F

## 2018-12-19 DIAGNOSIS — M7751 Other enthesopathy of right foot: Secondary | ICD-10-CM | POA: Diagnosis not present

## 2018-12-19 NOTE — Progress Notes (Signed)
She presents today date of surgery 12/13/2018 status post metatarsal osteotomy second and hammertoe second repair all on the right foot.  She denies fever chills nausea vomiting muscle aches pains calf pain back pain chest pain shortness of breath.  States that her second toe throbs occasionally but all in all the pain is manageable.  Objective: She presents today with a cam walker and a dry sterile compressive dressing was removed demonstrates mild edema no erythema cellulitis drainage or odor sutures are intact margins are well coapted.  No purulence no malodor no signs of infection.  Radiographs taken today demonstrate moderate edema on lateral x-ray however she does demonstrate good range of motion of the second toe.  Assessment: Well-healing surgical foot right.  Plan: Redressed today dressed a compressive dressing and I will follow-up with her in 1 week

## 2018-12-31 ENCOUNTER — Ambulatory Visit (INDEPENDENT_AMBULATORY_CARE_PROVIDER_SITE_OTHER): Payer: 59 | Admitting: Sports Medicine

## 2018-12-31 ENCOUNTER — Other Ambulatory Visit: Payer: Self-pay

## 2018-12-31 VITALS — Temp 97.9°F

## 2018-12-31 DIAGNOSIS — M7751 Other enthesopathy of right foot: Secondary | ICD-10-CM | POA: Diagnosis not present

## 2018-12-31 DIAGNOSIS — M2041 Other hammer toe(s) (acquired), right foot: Secondary | ICD-10-CM

## 2018-12-31 DIAGNOSIS — M79674 Pain in right toe(s): Secondary | ICD-10-CM

## 2018-12-31 DIAGNOSIS — Z9889 Other specified postprocedural states: Secondary | ICD-10-CM

## 2018-12-31 NOTE — Progress Notes (Signed)
Subjective: Christine Lloyd is a 48 y.o. female patient seen today in office for POV #2  (DOS 12/13/2018), S/P right second toe hammertoe repair and second metatarsal osteotomy with Dr. Milinda Pointer. Patient denies pain at surgical site currently and states that her toe feels a lot better this week, denies calf pain, denies headache, chest pain, shortness of breath, nausea, vomiting, fever, or chills. No other issues noted.   There are no active problems to display for this patient.   Current Outpatient Medications on File Prior to Visit  Medication Sig Dispense Refill  . amLODipine (NORVASC) 10 MG tablet Take 10 mg by mouth daily.    Marland Kitchen buPROPion (WELLBUTRIN XL) 300 MG 24 hr tablet Take 300 mg by mouth daily.    . cephALEXin (KEFLEX) 500 MG capsule Take 1 capsule (500 mg total) by mouth 3 (three) times daily. 30 capsule 0  . doxazosin (CARDURA) 2 MG tablet Take 2 mg by mouth daily.    . hydrochlorothiazide (HYDRODIURIL) 25 MG tablet     . methylPREDNISolone (MEDROL DOSEPAK) 4 MG TBPK tablet 6 day dose pack - take as directed 21 tablet 0  . ondansetron (ZOFRAN) 4 MG tablet Take 1 tablet (4 mg total) by mouth every 8 (eight) hours as needed for nausea or vomiting. 20 tablet 0   No current facility-administered medications on file prior to visit.     No Known Allergies  Objective: There were no vitals filed for this visit.  General: No acute distress, AAOx3  Right foot: Sutures intact with no gapping or dehiscence at surgical site, mild swelling to second toe on right foot, no erythema, no warmth, no drainage, no signs of infection noted, Capillary fill time <3 seconds in all digits, gross sensation present via light touch to right foot. No pain or crepitation with range of motion right foot however there is guarding at surgical site.  No pain with calf compression.   Assessment and Plan:  Problem List Items Addressed This Visit    None    Visit Diagnoses    Capsulitis of toe of right foot     -  Primary   Hammertoe of second toe of right foot       Toe pain, right       S/P foot surgery, right         -Patient seen and evaluated -Sutures removed -Applied dry sterile dressing to surgical site right foot.  Patient may remove dressings on tomorrow and shower and advised patient to closely monitor incision site and may use antibiotic cream as needed -Dispensed postop shoe  -Advised patient to limit activity to necessity  -Advised patient to ice and elevate as necessary  -Will plan for follow-up with Dr. Milinda Pointer at next office visit. In the meantime, patient to call office if any issues or problems arise.   Landis Martins, DPM

## 2019-01-16 ENCOUNTER — Other Ambulatory Visit: Payer: 59

## 2019-01-21 ENCOUNTER — Ambulatory Visit (INDEPENDENT_AMBULATORY_CARE_PROVIDER_SITE_OTHER): Payer: 59 | Admitting: Podiatry

## 2019-01-21 ENCOUNTER — Encounter: Payer: Self-pay | Admitting: Podiatry

## 2019-01-21 ENCOUNTER — Other Ambulatory Visit: Payer: Self-pay

## 2019-01-21 ENCOUNTER — Ambulatory Visit (INDEPENDENT_AMBULATORY_CARE_PROVIDER_SITE_OTHER): Payer: 59

## 2019-01-21 DIAGNOSIS — M7751 Other enthesopathy of right foot: Secondary | ICD-10-CM | POA: Diagnosis not present

## 2019-01-21 DIAGNOSIS — M2041 Other hammer toe(s) (acquired), right foot: Secondary | ICD-10-CM

## 2019-01-21 DIAGNOSIS — Z9889 Other specified postprocedural states: Secondary | ICD-10-CM

## 2019-01-21 NOTE — Progress Notes (Signed)
She presents today date of surgery 12/13/2018 status post second metatarsal osteotomy screw fixation arthroplasty PIPJ second digit all of the right foot.  Denies fever chills nausea vomiting muscle aches pains demonstrates some bone pain occasionally.  Objective: Presents today in her Darco shoe mild swelling no erythema cellulitis drainage odor good range of motion of the second metatarsal phalangeal joint.  Radiographs confirm well-healing osteotomy with internal fixation.  Assessment: Well-healing surgical foot.  Plan: Encouraged her to get back into her regular shoe gear massage therapy to the scar sites range of motion of the toe.  Follow-up with me in 3 to 4 weeks.

## 2019-01-29 ENCOUNTER — Other Ambulatory Visit: Payer: Self-pay | Admitting: Internal Medicine

## 2019-01-29 DIAGNOSIS — Z1231 Encounter for screening mammogram for malignant neoplasm of breast: Secondary | ICD-10-CM

## 2019-01-30 ENCOUNTER — Other Ambulatory Visit: Payer: 59

## 2019-02-04 ENCOUNTER — Other Ambulatory Visit: Payer: 59

## 2019-02-11 ENCOUNTER — Encounter: Payer: Self-pay | Admitting: Sports Medicine

## 2019-02-11 ENCOUNTER — Ambulatory Visit (INDEPENDENT_AMBULATORY_CARE_PROVIDER_SITE_OTHER): Payer: Self-pay | Admitting: Sports Medicine

## 2019-02-11 ENCOUNTER — Other Ambulatory Visit: Payer: Self-pay

## 2019-02-11 ENCOUNTER — Ambulatory Visit (INDEPENDENT_AMBULATORY_CARE_PROVIDER_SITE_OTHER): Payer: 59

## 2019-02-11 DIAGNOSIS — Z9889 Other specified postprocedural states: Secondary | ICD-10-CM

## 2019-02-11 DIAGNOSIS — M2041 Other hammer toe(s) (acquired), right foot: Secondary | ICD-10-CM

## 2019-02-11 DIAGNOSIS — M7751 Other enthesopathy of right foot: Secondary | ICD-10-CM | POA: Diagnosis not present

## 2019-02-11 NOTE — Progress Notes (Signed)
Subjective: Christine Lloyd is a 48 y.o. female patient seen today in office for POV (DOS 12/13/2018), S/P right second toe hammertoe repair and second metatarsal osteotomy with Dr. Milinda Pointer. Patient denies pain at surgical site currently and reports that she is doing well denies any constitutional symptoms at this time does admit that she accidentally bumped her foot 1 week ago and had pain on the lateral side but now the pain is gone and has been wearing her postop shoe just to be safe because she is afraid that she may bump her foot again or be very clumsy.  No other issues noted.  There are no active problems to display for this patient.   Current Outpatient Medications on File Prior to Visit  Medication Sig Dispense Refill  . amLODipine (NORVASC) 10 MG tablet Take 10 mg by mouth daily.    Marland Kitchen buPROPion (WELLBUTRIN XL) 300 MG 24 hr tablet Take 300 mg by mouth daily.    . cephALEXin (KEFLEX) 500 MG capsule Take 1 capsule (500 mg total) by mouth 3 (three) times daily. 30 capsule 0  . doxazosin (CARDURA) 2 MG tablet Take 2 mg by mouth daily.    . hydrochlorothiazide (HYDRODIURIL) 25 MG tablet     . methylPREDNISolone (MEDROL DOSEPAK) 4 MG TBPK tablet 6 day dose pack - take as directed 21 tablet 0  . ondansetron (ZOFRAN) 4 MG tablet Take 1 tablet (4 mg total) by mouth every 8 (eight) hours as needed for nausea or vomiting. 20 tablet 0   No current facility-administered medications on file prior to visit.     No Known Allergies  Objective: There were no vitals filed for this visit.  General: No acute distress, AAOx3  Right foot: Surgical site well-healed with mild thickening of scar noted at the second metatarsal phalangeal joint with postoperative swelling focal to the digit, no increased redness warmth drainage or signs of infection.  No reproducible pain to palpation of the right foot.  X-rays consistent with postoperative status. Assessment and Plan:  Problem List Items Addressed This  Visit    None    Visit Diagnoses    Capsulitis of toe of right foot    -  Primary   Relevant Orders   DG Foot Complete Right (Completed)   Hammertoe of second toe of right foot       Relevant Orders   DG Foot Complete Right (Completed)   S/P foot surgery, right       Relevant Orders   DG Foot Complete Right (Completed)     -Patient seen and evaluated -Encourage patient to wean as Dr. Milinda Pointer recommended at his last visit to a regular shoe as tolerated -Advised patient to continue with gentle massage and over-the-counter scar creams to prevent contracture -Advised patient to ice and elevate as necessary  -Will plan for follow-up with Dr. Milinda Pointer at next office visit. In the meantime, patient to call office if any issues or problems arise.   Landis Martins, DPM

## 2019-03-04 NOTE — Progress Notes (Signed)
DOS  12/13/2018  Second metatarsal osteotomy with fixation screw and hammertoe repair second toe (no fixation) right foot.

## 2019-03-13 ENCOUNTER — Ambulatory Visit (INDEPENDENT_AMBULATORY_CARE_PROVIDER_SITE_OTHER): Payer: 59

## 2019-03-13 ENCOUNTER — Ambulatory Visit (INDEPENDENT_AMBULATORY_CARE_PROVIDER_SITE_OTHER): Payer: 59 | Admitting: Podiatry

## 2019-03-13 ENCOUNTER — Encounter: Payer: Self-pay | Admitting: Podiatry

## 2019-03-13 ENCOUNTER — Other Ambulatory Visit: Payer: Self-pay

## 2019-03-13 DIAGNOSIS — M2041 Other hammer toe(s) (acquired), right foot: Secondary | ICD-10-CM

## 2019-03-13 DIAGNOSIS — Z9889 Other specified postprocedural states: Secondary | ICD-10-CM

## 2019-03-13 NOTE — Progress Notes (Signed)
Presents today date of surgery 12/13/2018 metatarsal osteotomy hammertoe repair second.  She denies fever chills nausea vomiting states that the bump hurts a little bit on the top but she points to the PIPJ of the second toe as the toe cocked up and she says most painful when it swells.  Objective: Vital signs are stable she is alert and oriented x3 she admits to not manipulating the tissue as she should have however the toe is sitting rectus that has a mild curvature to it as do the other toes but the scar is hypertrophic both the one on the toe as well as the one on the second metatarsal osteotomy site.  Radiographs taken today demonstrate a well-healing arthroplasty of the second PIPJ as well as a second metatarsal osteotomy.  Assessment: Well-healing surgical foot.  Plan: Distributed digital splint to hold the toe down and discussed utilizing this with her I will follow-up with her on an as-needed basis and she is otherwise discharged.

## 2019-05-07 ENCOUNTER — Ambulatory Visit
Admission: RE | Admit: 2019-05-07 | Discharge: 2019-05-07 | Disposition: A | Discharge status: Home / Self Care | Payer: 59 | Source: Ambulatory Visit | Attending: Internal Medicine | Admitting: Internal Medicine

## 2019-05-07 ENCOUNTER — Other Ambulatory Visit: Payer: Self-pay

## 2019-05-07 DIAGNOSIS — Z1231 Encounter for screening mammogram for malignant neoplasm of breast: Secondary | ICD-10-CM

## 2020-06-21 ENCOUNTER — Other Ambulatory Visit: Payer: Self-pay | Admitting: Internal Medicine

## 2020-06-21 DIAGNOSIS — Z1231 Encounter for screening mammogram for malignant neoplasm of breast: Secondary | ICD-10-CM

## 2020-07-27 HISTORY — PX: BREAST BIOPSY: SHX20

## 2020-07-28 ENCOUNTER — Other Ambulatory Visit: Payer: Self-pay

## 2020-07-28 ENCOUNTER — Ambulatory Visit
Admission: RE | Admit: 2020-07-28 | Discharge: 2020-07-28 | Disposition: A | Discharge status: Home / Self Care | Payer: 59 | Source: Ambulatory Visit | Attending: Internal Medicine | Admitting: Internal Medicine

## 2020-07-28 DIAGNOSIS — Z1231 Encounter for screening mammogram for malignant neoplasm of breast: Secondary | ICD-10-CM

## 2020-07-30 ENCOUNTER — Other Ambulatory Visit: Payer: Self-pay | Admitting: Internal Medicine

## 2020-07-30 DIAGNOSIS — R928 Other abnormal and inconclusive findings on diagnostic imaging of breast: Secondary | ICD-10-CM

## 2020-08-09 ENCOUNTER — Other Ambulatory Visit: Payer: Self-pay

## 2020-08-09 ENCOUNTER — Ambulatory Visit
Admission: RE | Admit: 2020-08-09 | Discharge: 2020-08-09 | Disposition: A | Discharge status: Home / Self Care | Payer: 59 | Source: Ambulatory Visit | Attending: Internal Medicine | Admitting: Internal Medicine

## 2020-08-09 ENCOUNTER — Other Ambulatory Visit: Payer: Self-pay | Admitting: Internal Medicine

## 2020-08-09 DIAGNOSIS — R928 Other abnormal and inconclusive findings on diagnostic imaging of breast: Secondary | ICD-10-CM

## 2020-08-12 ENCOUNTER — Other Ambulatory Visit: Payer: 59

## 2020-08-16 ENCOUNTER — Other Ambulatory Visit: Payer: Self-pay

## 2020-08-16 ENCOUNTER — Ambulatory Visit
Admission: RE | Admit: 2020-08-16 | Discharge: 2020-08-16 | Disposition: A | Discharge status: Home / Self Care | Payer: 59 | Source: Ambulatory Visit | Attending: Internal Medicine | Admitting: Internal Medicine

## 2020-08-16 DIAGNOSIS — R928 Other abnormal and inconclusive findings on diagnostic imaging of breast: Secondary | ICD-10-CM

## 2021-02-10 ENCOUNTER — Other Ambulatory Visit: Payer: Self-pay | Admitting: Internal Medicine

## 2021-08-29 ENCOUNTER — Other Ambulatory Visit: Payer: Self-pay | Admitting: Internal Medicine

## 2021-08-29 DIAGNOSIS — Z1231 Encounter for screening mammogram for malignant neoplasm of breast: Secondary | ICD-10-CM

## 2021-09-07 ENCOUNTER — Ambulatory Visit
Admission: RE | Admit: 2021-09-07 | Discharge: 2021-09-07 | Disposition: A | Discharge status: Home / Self Care | Payer: 59 | Source: Ambulatory Visit | Attending: Internal Medicine | Admitting: Internal Medicine

## 2021-09-07 DIAGNOSIS — Z1231 Encounter for screening mammogram for malignant neoplasm of breast: Secondary | ICD-10-CM

## 2021-12-23 DIAGNOSIS — Z9884 Bariatric surgery status: Secondary | ICD-10-CM | POA: Diagnosis not present

## 2022-02-08 DIAGNOSIS — Z9884 Bariatric surgery status: Secondary | ICD-10-CM | POA: Diagnosis not present

## 2022-02-24 DIAGNOSIS — Z9884 Bariatric surgery status: Secondary | ICD-10-CM | POA: Diagnosis not present

## 2022-03-10 DIAGNOSIS — R7301 Impaired fasting glucose: Secondary | ICD-10-CM | POA: Diagnosis not present

## 2022-03-10 DIAGNOSIS — I10 Essential (primary) hypertension: Secondary | ICD-10-CM | POA: Diagnosis not present

## 2022-03-10 DIAGNOSIS — E559 Vitamin D deficiency, unspecified: Secondary | ICD-10-CM | POA: Diagnosis not present

## 2022-03-27 DIAGNOSIS — Z1151 Encounter for screening for human papillomavirus (HPV): Secondary | ICD-10-CM | POA: Diagnosis not present

## 2022-03-27 DIAGNOSIS — Z124 Encounter for screening for malignant neoplasm of cervix: Secondary | ICD-10-CM | POA: Diagnosis not present

## 2022-03-27 DIAGNOSIS — Z01419 Encounter for gynecological examination (general) (routine) without abnormal findings: Secondary | ICD-10-CM | POA: Diagnosis not present

## 2022-04-21 DIAGNOSIS — D241 Benign neoplasm of right breast: Secondary | ICD-10-CM | POA: Diagnosis not present

## 2022-04-21 DIAGNOSIS — I10 Essential (primary) hypertension: Secondary | ICD-10-CM | POA: Diagnosis not present

## 2022-04-21 DIAGNOSIS — Z1212 Encounter for screening for malignant neoplasm of rectum: Secondary | ICD-10-CM | POA: Diagnosis not present

## 2022-04-21 DIAGNOSIS — Z1331 Encounter for screening for depression: Secondary | ICD-10-CM | POA: Diagnosis not present

## 2022-04-21 DIAGNOSIS — R82998 Other abnormal findings in urine: Secondary | ICD-10-CM | POA: Diagnosis not present

## 2022-04-21 DIAGNOSIS — Z0000 Encounter for general adult medical examination without abnormal findings: Secondary | ICD-10-CM | POA: Diagnosis not present

## 2022-04-21 DIAGNOSIS — Z1339 Encounter for screening examination for other mental health and behavioral disorders: Secondary | ICD-10-CM | POA: Diagnosis not present

## 2022-05-30 DIAGNOSIS — H35033 Hypertensive retinopathy, bilateral: Secondary | ICD-10-CM | POA: Diagnosis not present

## 2022-09-11 ENCOUNTER — Other Ambulatory Visit: Payer: Self-pay

## 2022-09-11 DIAGNOSIS — Z1231 Encounter for screening mammogram for malignant neoplasm of breast: Secondary | ICD-10-CM

## 2022-09-12 ENCOUNTER — Ambulatory Visit
Admission: RE | Admit: 2022-09-12 | Discharge: 2022-09-12 | Disposition: A | Discharge status: Home / Self Care | Payer: BC Managed Care – PPO | Source: Ambulatory Visit

## 2022-09-12 DIAGNOSIS — Z1231 Encounter for screening mammogram for malignant neoplasm of breast: Secondary | ICD-10-CM

## 2022-09-21 DIAGNOSIS — E559 Vitamin D deficiency, unspecified: Secondary | ICD-10-CM | POA: Diagnosis not present

## 2022-09-21 DIAGNOSIS — I10 Essential (primary) hypertension: Secondary | ICD-10-CM | POA: Diagnosis not present

## 2022-09-21 DIAGNOSIS — E7800 Pure hypercholesterolemia, unspecified: Secondary | ICD-10-CM | POA: Diagnosis not present

## 2022-09-21 DIAGNOSIS — R7301 Impaired fasting glucose: Secondary | ICD-10-CM | POA: Diagnosis not present

## 2023-01-15 ENCOUNTER — Encounter: Payer: Self-pay | Admitting: *Deleted

## 2023-01-15 ENCOUNTER — Ambulatory Visit
Admission: EM | Admit: 2023-01-15 | Discharge: 2023-01-15 | Disposition: A | Discharge status: Home / Self Care | Payer: No Typology Code available for payment source | Attending: Internal Medicine | Admitting: Internal Medicine

## 2023-01-15 DIAGNOSIS — I10 Essential (primary) hypertension: Secondary | ICD-10-CM | POA: Diagnosis present

## 2023-01-15 DIAGNOSIS — Z79899 Other long term (current) drug therapy: Secondary | ICD-10-CM | POA: Insufficient documentation

## 2023-01-15 DIAGNOSIS — Z113 Encounter for screening for infections with a predominantly sexual mode of transmission: Secondary | ICD-10-CM | POA: Diagnosis present

## 2023-01-15 DIAGNOSIS — Z793 Long term (current) use of hormonal contraceptives: Secondary | ICD-10-CM | POA: Diagnosis not present

## 2023-01-15 DIAGNOSIS — F419 Anxiety disorder, unspecified: Secondary | ICD-10-CM | POA: Insufficient documentation

## 2023-01-15 DIAGNOSIS — N898 Other specified noninflammatory disorders of vagina: Secondary | ICD-10-CM | POA: Insufficient documentation

## 2023-01-15 DIAGNOSIS — F32A Depression, unspecified: Secondary | ICD-10-CM | POA: Insufficient documentation

## 2023-01-15 NOTE — Discharge Instructions (Signed)
Vaginal swab is pending.  We will call when it results and send any appropriate treatment.  Please take your blood pressure medication immediately!  Monitor closely and follow-up with the emergency department if it does not decrease.

## 2023-01-15 NOTE — ED Provider Notes (Signed)
[facility]    CSN: [id number] Arrival date & time: 01/15/23  1810      History   Chief Complaint Chief Complaint  Patient presents with   Vaginal Discharge    HPI Christine Lloyd is a 52 y.o. female.   Patient presents with vaginal irritation, vaginal discharge, vaginal odor that started about 6 days ago.  Reports that it seemed to start after using a new soap.  Denies any exposure to STD but has had unprotected intercourse prior to symptoms starting.  Denies any dysuria, urinary frequency, abnormal vaginal bleeding, abdominal pain, back pain, fever, chills.  Patient reports that she is currently postmenopausal and is no longer having menstrual cycles.  Blood pressure is significantly elevated today but patient reports that she has not taken her blood pressure medication in a few days.  Denies headache, dizziness, blurred vision, nausea, vomiting, chest pain, shortness of breath.  Patient states that this has occurred before when she was at a doctor's office after not taking her medication.   Vaginal Discharge   Past Medical History:  Diagnosis Date   Allergy    Anxiety    Depression    Hypertension     There are no problems to display for this patient.   Past Surgical History:  Procedure Laterality Date   BREAST BIOPSY Right 07/2020   BREAST SURGERY     bunion/hammer toe surgery     CESAREAN SECTION     COSMETIC SURGERY     REDUCTION MAMMAPLASTY Bilateral    2010    OB History   No obstetric history on file.      Home Medications    Prior to Admission medications   Medication Sig Start Date End Date Taking? Authorizing Provider  amLODipine-benazepril (LOTREL) 10-20 MG capsule Take by mouth daily. 01/11/19  Yes [provider]  hydrochlorothiazide (HYDRODIURIL) 25 MG tablet  03/15/15  Yes [provider]  levonorgestrel (MIRENA, 52 MG,) 20 MCG/DAY IUD See admin instructions.   Yes [provider]   Semaglutide, 1 MG/DOSE, (OZEMPIC, 1 MG/DOSE,) 4 MG/3ML SOPN INJECT 1MG  SUBCUTANEOUS ONCE A WEEK 30 DAYS 04/26/21  Yes [provider]  venlafaxine XR (EFFEXOR-XR) 75 MG 24 hr capsule Take 75 mg by mouth daily.   Yes [provider]  amLODipine (NORVASC) 10 MG tablet Take 10 mg by mouth daily.    [provider]  buPROPion (WELLBUTRIN XL) 300 MG 24 hr tablet Take 300 mg by mouth daily. 07/30/18   [provider]  doxazosin (CARDURA) 2 MG tablet Take 2 mg by mouth daily. 10/09/18   [provider]  estradiol (VIVELLE-DOT) 0.0375 MG/24HR APPLY 1 PATCH TO SKIN TWO TIMES A WEEK TRANSDERMAL 30 DAY(S) 01/27/19   [provider]  methocarbamol (ROBAXIN) 500 MG tablet TAKE 1 TABLET BY MOUTH THREE TIMES A DAY AS NEEDED FOR MUSCLE PAIN OR SPASM 11/27/18   [provider]  naproxen (NAPROSYN) 500 MG tablet TAKE 1 TABLET BY MOUTH TWICE A DAY WITH FOOD FOR INFLAMMATION 01/28/19   [provider]  valACYclovir (VALTREX) 1000 MG tablet 1 TABLET EVERY 12 HOURS X 2 DOSES PRN COLD SORES. ORALLY 1 DAY(S) 01/27/19   [provider]    Family History Family History  Problem Relation Age of Onset   Hypertension Father    Diabetes Brother    Diabetes Maternal Aunt    Diabetes Other    Breast cancer Neg Hx     Social History Social  History   Tobacco Use   Smoking status: Never   Smokeless tobacco: Never  Vaping Use   Vaping status: Never Used  Substance Use Topics   Alcohol use: No   Drug use: No     Allergies   Patient has no known allergies.   Review of Systems Review of Systems Per HPI  Physical Exam Triage Vital Signs ED Triage Vitals  Encounter Vitals Group     BP 01/15/23 1823 (!) 209/106     Systolic BP Percentile --      Diastolic BP Percentile --      Pulse Rate 01/15/23 1823 76     Resp 01/15/23 1823 16     Temp 01/15/23 1823 98.3 F (36.8 C)     Temp Source 01/15/23 1823 Oral     SpO2 01/15/23 1823 98 %      Weight --      Height --      Head Circumference --      Peak Flow --      Pain Score 01/15/23 1826 0     Pain Loc --      Pain Education --      Exclude from Growth Chart --    No data found.  Updated Vital Signs BP (!) 193/110 (BP Location: Left Arm)   Pulse 76   Temp 98.3 F (36.8 C) (Oral)   Resp 16   LMP  (LMP Unknown)   SpO2 98%   Visual Acuity Right Eye Distance:   Left Eye Distance:   Bilateral Distance:    Right Eye Near:   Left Eye Near:    Bilateral Near:     Physical Exam Constitutional:      General: She is not in acute distress.    Appearance: Normal appearance. She is not toxic-appearing or diaphoretic.  HENT:     Head: Normocephalic and atraumatic.  Eyes:     Extraocular Movements: Extraocular movements intact.     Conjunctiva/sclera: Conjunctivae normal.  Pulmonary:     Effort: Pulmonary effort is normal.  Genitourinary:    Comments: Deferred with shared decision making. Self swab performed. Neurological:     General: No focal deficit present.     Mental Status: She is alert and oriented to person, place, and time. Mental status is at baseline.     Cranial Nerves: Cranial nerves 2-12 are intact.     Sensory: Sensation is intact.     Motor: Motor function is intact.     Coordination: Coordination is intact.     Gait: Gait is intact.  Psychiatric:        Mood and Affect: Mood normal.        Behavior: Behavior normal.        Thought Content: Thought content normal.        Judgment: Judgment normal.      UC Treatments / Results  Labs (all labs ordered are listed, but only abnormal results are displayed) Labs Reviewed  CERVICOVAGINAL ANCILLARY ONLY    EKG   Radiology No results found.  Procedures Procedures (including critical care time)  Medications Ordered in UC Medications - No data to display  Initial Impression / Assessment and Plan / UC Course  I have reviewed the triage vital signs and the nursing notes.  Pertinent  labs & imaging results that were available during my care of the patient were reviewed by me and considered in my medical decision making (see chart for details).  Cervicovaginal swab pending.  Most likely bacterial vaginosis but will await result prior to treatment.  Blood pressure is also significantly elevated but she has not taken her medication today.  Patient is currently asymptomatic, therefore do not think that emergent evaluation is necessary given that this is due to her not taking her medication.  Patient does have medication available at home so encouraged her to take medication as soon as possible and monitor blood pressure diligently over the next 24 hours.  Encouraged her to go to the ER if blood pressure remains elevated despite taking her medication or if she develops any associated neurological symptoms, chest pain, shortness of breath.  Patient verbalized understanding and was agreeable with plan. Final Clinical Impressions(s) / UC Diagnoses   Final diagnoses:  Vaginal discharge  Vaginal odor  Vaginal irritation  Screening examination for venereal disease  Elevated blood pressure reading in office with diagnosis of hypertension     Discharge Instructions      Vaginal swab is pending.  We will call when it results and send any appropriate treatment.  Please take your blood pressure medication immediately!  Monitor closely and follow-up with the emergency department if it does not decrease.    ED Prescriptions   None    PDMP not reviewed this encounter.   Gustavus Bryant, [place] 01/15/23 [phone]

## 2023-01-15 NOTE — ED Triage Notes (Signed)
C/O vaginal irritation, odor, and slight vaginal discharge onset approx 6 days ago after using a new soap. Denies any HA.

## 2023-01-16 LAB — CERVICOVAGINAL ANCILLARY ONLY
Bacterial Vaginitis (gardnerella): POSITIVE — AB
Candida Glabrata: NEGATIVE
Candida Vaginitis: NEGATIVE
Chlamydia: NEGATIVE
Comment: NEGATIVE
Comment: NEGATIVE
Comment: NEGATIVE
Comment: NEGATIVE
Comment: NEGATIVE
Comment: NORMAL
Neisseria Gonorrhea: NEGATIVE
Trichomonas: NEGATIVE

## 2023-01-17 ENCOUNTER — Telehealth: Payer: Self-pay

## 2023-01-17 MED ORDER — METRONIDAZOLE 500 MG PO TABS: 500.0000 mg | ORAL_TABLET | Freq: Two times a day (BID) | ORAL | 0 refills | Status: AC

## 2023-01-17 NOTE — Telephone Encounter (Signed)
Patient calling for Rx due to + BV swab (saw it in EPIC).  Sent to provider for Rx to be sent.  [facility]  ([address]). Updated.  B. Roten CMA

## 2023-11-27 ENCOUNTER — Ambulatory Visit: Admitting: Physician Assistant

## 2023-11-27 ENCOUNTER — Encounter: Payer: Self-pay | Admitting: Physician Assistant

## 2023-11-27 VITALS — BP 187/99 | HR 77 | Ht 62.0 in

## 2023-11-27 DIAGNOSIS — F5104 Psychophysiologic insomnia: Secondary | ICD-10-CM

## 2023-11-27 DIAGNOSIS — F333 Major depressive disorder, recurrent, severe with psychotic symptoms: Secondary | ICD-10-CM | POA: Diagnosis not present

## 2023-11-27 DIAGNOSIS — F411 Generalized anxiety disorder: Secondary | ICD-10-CM | POA: Diagnosis not present

## 2023-11-27 DIAGNOSIS — I10 Essential (primary) hypertension: Secondary | ICD-10-CM

## 2023-11-27 DIAGNOSIS — Z0000 Encounter for general adult medical examination without abnormal findings: Secondary | ICD-10-CM

## 2023-11-27 DIAGNOSIS — Z1231 Encounter for screening mammogram for malignant neoplasm of breast: Secondary | ICD-10-CM

## 2023-11-27 DIAGNOSIS — L309 Dermatitis, unspecified: Secondary | ICD-10-CM

## 2023-11-27 MED ORDER — VENLAFAXINE HCL ER 37.5 MG PO CP24
37.5000 mg | ORAL_CAPSULE | Freq: Every day | ORAL | 0 refills | Status: AC
Start: 2023-11-27 — End: ?

## 2023-11-27 MED ORDER — SERTRALINE HCL 50 MG PO TABS
50.0000 mg | ORAL_TABLET | Freq: Every day | ORAL | 3 refills | Status: AC
Start: 2023-11-27 — End: ?

## 2023-11-27 MED ORDER — HYDROXYZINE HCL 25 MG PO TABS
25.0000 mg | ORAL_TABLET | Freq: Every evening | ORAL | 1 refills | Status: AC | PRN
Start: 2023-11-27 — End: ?

## 2023-11-27 MED ORDER — TRIAMCINOLONE ACETONIDE 0.1 % EX CREA
1.0000 | TOPICAL_CREAM | Freq: Two times a day (BID) | CUTANEOUS | 0 refills | Status: AC
Start: 2023-11-27 — End: ?

## 2023-11-27 NOTE — Patient Instructions (Addendum)
 VISIT SUMMARY:  Today, we discussed your concerns about a persistent rash and itching, as well as your hypertension, anxiety, depression, and sleep disturbances. We reviewed your current medications and made some adjustments to better manage your symptoms.  YOUR PLAN:  -HYPERTENSION: Hypertension means high blood pressure. You need to take your amlodipine and hydrochlorothiazide daily and monitor your blood pressure at home regularly. We will follow up in 3-4 weeks to see how you are doing.  -DEPRESSION AND ANXIETY: Depression and anxiety are mental health conditions that can affect your mood and overall well-being. Since venlafaxine is not working well for you, we will switch you to Zoloft (sertraline) and refer you to psychiatry for counseling. Please monitor your mood and anxiety symptoms and let us  know if you have any concerns.  -PRURITUS: Pruritus means itching. Although the rash has resolved, the itching persists. We will prescribe a steroid cream and hydroxyzine to help with the itching and to aid your sleep.  -INSOMNIA: Insomnia is difficulty sleeping. Your sleep issues are likely linked to anxiety. We will prescribe hydroxyzine to help you sleep.  -GENERAL HEALTH MAINTENANCE: You are due for a mammogram, which is an important screening test for breast health. We will order this test for you at the breast center.  How to Take Your Blood Pressure Blood pressure is a measurement of how strongly your blood is pressing against the walls of your arteries. Arteries are blood vessels that carry blood from your heart throughout your body. Your health care provider takes your blood pressure at each office visit. You can also take your own blood pressure at home with a blood pressure monitor. You may need to take your own blood pressure to: Confirm a diagnosis of high blood pressure (hypertension). Monitor your blood pressure over time. Make sure your blood pressure medicine is working. Supplies  needed: Blood pressure monitor. A chair to sit in. This should be a chair where you can sit upright with your back supported. Do not sit on a soft couch or an armchair. Table or desk. Small notebook and pencil or pen. How to prepare To get the most accurate reading, avoid the following for 30 minutes before you check your blood pressure: Drinking caffeine. Drinking alcohol . Eating. Smoking. Exercising. Five minutes before you check your blood pressure: Use the bathroom and urinate so that you have an empty bladder. Sit quietly in a chair. Do not talk. How to take your blood pressure To check your blood pressure, follow the instructions in the manual that came with your blood pressure monitor. If you have a digital blood pressure monitor, the instructions may be as follows: Sit up straight in a chair. Place your feet on the floor. Do not cross your ankles or legs. Rest your left arm at the level of your heart on a table or desk or on the arm of a chair. Pull up your shirt sleeve. Wrap the blood pressure cuff around the upper part of your left arm, 1 inch (2.5 cm) above your elbow. It is best to wrap the cuff around bare skin. Fit the cuff snugly, but not too tightly, around your arm. You should be able to place only one finger between the cuff and your arm. Position the cord so that it rests in the bend of your elbow. Press the power button. Sit quietly while the cuff inflates and deflates. Read the digital reading on the monitor screen and write the numbers down (record them) in a notebook. Wait 2-3  minutes, then repeat the steps, starting at step 1. What does my blood pressure reading mean? A blood pressure reading consists of a higher number over a lower number. Ideally, your blood pressure should be below 120/80. The first ("top") number is called the systolic pressure. It is a measure of the pressure in your arteries as your heart beats. The second ("bottom") number is called the  diastolic pressure. It is a measure of the pressure in your arteries as the heart relaxes. Blood pressure is classified into four stages. The following are the stages for adults who do not have a short-term serious illness or a chronic condition. Systolic pressure and diastolic pressure are measured in a unit called mm Hg (millimeters of mercury).  Normal Systolic pressure: below 120. Diastolic pressure: below 80. Elevated Systolic pressure: 120-129. Diastolic pressure: below 80. Hypertension stage 1 Systolic pressure: 130-139. Diastolic pressure: 80-89. Hypertension stage 2 Systolic pressure: 140 or above. Diastolic pressure: 90 or above. You can have elevated blood pressure or hypertension even if only the systolic or only the diastolic number in your reading is higher than normal. Follow these instructions at home: Medicines Take over-the-counter and prescription medicines only as told by your health care provider. Tell your health care provider if you are having any side effects from blood pressure medicine. General instructions Check your blood pressure as often as recommended by your health care provider. Check your blood pressure at the same time every day. Take your monitor to the next appointment with your health care provider to make sure that: You are using it correctly. It provides accurate readings. Understand what your goal blood pressure numbers are. Keep all follow-up visits. This is important. General tips Your health care provider can suggest a reliable monitor that will meet your needs. There are several types of home blood pressure monitors. Choose a monitor that has an arm cuff. Do not choose a monitor that measures your blood pressure from your wrist or finger. Choose a cuff that wraps snugly, not too tight or too loose, around your upper arm. You should be able to fit only one finger between your arm and the cuff. You can buy a blood pressure monitor at most  drugstores or online. Where to find more information American Heart Association: www.heart.org Contact a health care provider if: Your blood pressure is consistently high. Your blood pressure is suddenly low. Get help right away if: Your systolic blood pressure is higher than 180. Your diastolic blood pressure is higher than 120. These symptoms may be an emergency. Get help right away. Call 911. Do not wait to see if the symptoms will go away. Do not drive yourself to the hospital. Summary Blood pressure is a measurement of how strongly your blood is pressing against the walls of your arteries. A blood pressure reading consists of a higher number over a lower number. Ideally, your blood pressure should be below 120/80. Check your blood pressure at the same time every day. Avoid caffeine, alcohol , smoking, and exercise for 30 minutes prior to checking your blood pressure. These agents can affect the accuracy of the blood pressure reading. This information is not intended to replace advice given to you by your health care provider. Make sure you discuss any questions you have with your health care provider. Document Revised: 02/24/2021 Document Reviewed: 02/24/2021 Elsevier Patient Education  2024 [employer] Inc.  Managing Anxiety, Adult After being diagnosed with anxiety, you may be relieved to know why you have felt or behaved  a certain way. You may also feel overwhelmed about the treatment ahead and what it will mean for your life. With care and support, you can manage your anxiety. How to manage lifestyle changes Understanding the difference between stress and anxiety Although stress can play a role in anxiety, it is not the same as anxiety. Stress is your body's reaction to life changes and events, both good and bad. Stress is often caused by something external, such as a deadline, test, or competition. It normally goes away after the event has ended and will last just a few hours. But,  stress can be ongoing and can lead to more than just stress. Anxiety is caused by something internal, such as imagining a terrible outcome or worrying that something will go wrong that will greatly upset you. Anxiety often does not go away even after the event is over, and it can become a long-term (chronic) worry. Lowering stress and anxiety Talk with your health care provider or a counselor to learn more about lowering anxiety and stress. They may suggest tension-reduction techniques, such as: Music. Spend time creating or listening to music that you enjoy and that inspires you. Mindfulness-based meditation. Practice being aware of your normal breaths while not trying to control your breathing. It can be done while sitting or walking. Centering prayer. Focus on a word, phrase, or sacred image that means something to you and brings you peace. Deep breathing. Expand your stomach and inhale slowly through your nose. Hold your breath for 3-5 seconds. Then breathe out slowly, letting your stomach muscles relax. Self-talk. Learn to notice and spot thought patterns that lead to anxiety reactions. Change those patterns to thoughts that feel peaceful. Muscle relaxation. Take time to tense muscles and then relax them. Choose a tension-reduction technique that fits your lifestyle and personality. These techniques take time and practice. Set aside 5-15 minutes a day to do them. Specialized therapists can offer counseling and training in these techniques. The training to help with anxiety may be covered by some insurance plans. Other things you can do to manage stress and anxiety include: Keeping a stress diary. This can help you learn what triggers your reaction and then learn ways to manage your response. Thinking about how you react to certain situations. You may not be able to control everything, but you can control your response. Making time for activities that help you relax and not feeling guilty about  spending your time in this way. Doing visual imagery. This involves imagining or creating mental pictures to help you relax. Practicing yoga. Through yoga poses, you can lower tension and relax.  Medicines Medicines for anxiety include: Antidepressant medicines. These are usually prescribed for long-term daily control. Anti-anxiety medicines. These may be added in severe cases, especially when panic attacks occur. When used together, medicines, psychotherapy, and tension-reduction techniques may be the most effective treatment. Relationships Relationships can play a big part in helping you recover. Spend more time connecting with trusted friends and family members. Think about going to couples counseling if you have a partner, taking family education classes, or going to family therapy. Therapy can help you and others better understand your anxiety. How to recognize changes in your anxiety Everyone responds differently to treatment for anxiety. Recovery from anxiety happens when symptoms lessen and stop interfering with your daily life at home or work. This may mean that you will start to: Have better concentration and focus. Worry will interfere less in your daily thinking. Sleep better. Be  less irritable. Have more energy. Have improved memory. Try to recognize when your condition is getting worse. Contact your provider if your symptoms interfere with home or work and you feel like your condition is not improving. Follow these instructions at home: Activity Exercise. Adults should: Exercise for at least 150 minutes each week. The exercise should increase your heart rate and make you sweat (moderate-intensity exercise). Do strengthening exercises at least twice a week. Get the right amount and quality of sleep. Most adults need 7-9 hours of sleep each night. Lifestyle  Eat a healthy diet that includes plenty of vegetables, fruits, whole grains, low-fat dairy products, and lean  protein. Do not eat a lot of foods that are high in fats, added sugars, or salt (sodium). Make choices that simplify your life. Do not use any products that contain nicotine or tobacco. These products include cigarettes, chewing tobacco, and vaping devices, such as e-cigarettes. If you need help quitting, ask your provider. Avoid caffeine, alcohol , and certain over-the-counter cold medicines. These may make you feel worse. Ask your pharmacist which medicines to avoid. General instructions Take over-the-counter and prescription medicines only as told by your provider. Keep all follow-up visits. This is to make sure you are managing your anxiety well or if you need more support. Where to find support You can get help and support from: Self-help groups. Online and [employer]. A trusted spiritual leader. Couples counseling. Family education classes. Family therapy. Where to find more information You may find that joining a support group helps you deal with your anxiety. The following sources can help you find counselors or support groups near you: Mental Health America: mentalhealthamerica.net Anxiety and Depression Association of [place] (ADAA): adaa.org The [employer] on Mental Illness (NAMI): nami.org Contact a health care provider if: You have a hard time staying focused or finishing tasks. You spend many hours a day feeling worried about everyday life. You are very tired because you cannot stop worrying. You start to have headaches or often feel tense. You have chronic nausea or diarrhea. Get help right away if: Your heart feels like it is racing. You have shortness of breath. You have thoughts of hurting yourself or others. Get help right away if you feel like you may hurt yourself or others, or have thoughts about taking your own life. Go to your nearest emergency room or: Call 911. Call the National Suicide Prevention Lifeline at 516-653-1959 or 988. This is  open 24 hours a day. Text the Crisis Text Line at 8197483795. This information is not intended to replace advice given to you by your health care provider. Make sure you discuss any questions you have with your health care provider. Document Revised: 03/21/2022 Document Reviewed: 10/03/2020 Elsevier Patient Education  2024 [employer].

## 2023-11-27 NOTE — Progress Notes (Unsigned)
 New Patient Office Visit  Subjective    Patient ID: Christine Lloyd, female    DOB: January 02, 1971  Age: 53 y.o. MRN: [id number]  CC:  Chief Complaint  Patient presents with   Rash    Patient states she broke out into a rash a few weeks ago, the rash has cleared but she is still feeling itching    Discussed the use of AI scribe software for clinical note transcription with the patient, who gave verbal consent to proceed.  History of Present Illness  History of Present Illness Christine Lloyd is a 53 year old female who presents with concerns about a persistent rash and itching.  The rash and itching began in April, initially presenting as red and slightly raised on the chest and arms. The rash has resolved, but itching persists in the same areas. She has used Calamine lotion and over-the-counter hydrocortisone cream without relief. No new medications, detergents, or body products have been introduced, and no similar symptoms are present in her household.  She has hypertension and takes amlodipine and hydrochlorothiazide, though she missed her dose today and is inconsistent with her medication regimen. Her blood pressure was elevated about a year ago, and she monitors it at home but has not checked it recently.  She experiences sleep disturbances, waking in the middle of the night due to anxiety and stress. Melatonin helps her fall asleep but not stay asleep. She takes venlafaxine for anxiety and depression but finds it ineffective. She has increased anxiety and perfectionism, contributing to stress and sleep issues. She has had thoughts of being 'better off dead' but is open to counseling.  Physical Exam VITALS: BP- 187/99 GENERAL: Alert, cooperative, well developed, no acute distress. HEENT: Normocephalic, normal oropharynx, moist mucous membranes. NECK: No abnormalities. CHEST: Clear to auscultation bilaterally, no wheezes, rhonchi, or crackles. CARDIOVASCULAR:  Normal heart rate and rhythm, S1 and S2 normal without murmurs. ABDOMEN: Soft, non-tender, non-distended, without organomegaly, normal bowel sounds. EXTREMITIES: No cyanosis or edema. NEUROLOGICAL: Cranial nerves grossly intact, moves all extremities without gross motor or sensory deficit.  Results RADIOLOGY Mammogram: Normal (08/2022)  Assessment and Plan Hypertension Poorly controlled hypertension with current regimen. Non-adherence to medication and lack of home monitoring noted. - Instructed to take amlodipine and hydrochlorothiazide daily. - Advised regular home blood pressure monitoring. - Scheduled follow-up in 3-4 weeks.  Depression and Anxiety Venlafaxine deemed ineffective. Increased anxiety and self-harm thoughts reported. Zoloft recommended for better efficacy and minimal blood pressure impact. - Taper venlafaxine and initiate Zoloft (sertraline). - Referred to psychiatry for counseling. - Monitor mood and anxiety symptoms.  Pruritus Persistent itching without visible rash. Previous treatments ineffective. Cause unknown. - Prescribe lotion for itching. - Prescribe hydroxyzine for itching and sleep.  Insomnia Chronic insomnia linked to anxiety. Melatonin ineffective. Hydroxyzine recommended as non-addictive option. - Prescribe hydroxyzine for sleep. - Consider counseling referral for anxiety.  General Health Maintenance Due for mammogram. Last done over a year ago. - Order mammogram at breast center.  Follow-up Monitor blood pressure, medication transition, and health status. Labs needed for comprehensive assessment. - Schedule follow-up in 3-4 weeks. - Perform labs for anemia, kidney and liver function, thyroid , and vitamin D. - Review lab results and communicate via MyChart.   Outpatient Encounter Medications as of 11/27/2023  Medication Sig   amLODipine (NORVASC) 10 MG tablet Take 10 mg by mouth daily.   levonorgestrel (MIRENA, 52 MG,) 20 MCG/DAY IUD See  admin instructions.   methocarbamol (ROBAXIN) 500 MG  tablet TAKE 1 TABLET BY MOUTH THREE TIMES A DAY AS NEEDED FOR MUSCLE PAIN OR SPASM   valACYclovir (VALTREX) 1000 MG tablet 1 TABLET EVERY 12 HOURS X 2 DOSES PRN COLD SORES. ORALLY 1 DAY(S)   venlafaxine XR (EFFEXOR-XR) 75 MG 24 hr capsule Take 75 mg by mouth daily.   [DISCONTINUED] amLODipine-benazepril (LOTREL) 10-20 MG capsule Take by mouth daily.   buPROPion (WELLBUTRIN XL) 300 MG 24 hr tablet Take 300 mg by mouth daily. (Patient not taking: Reported on 11/27/2023)   doxazosin (CARDURA) 2 MG tablet Take 2 mg by mouth daily.   estradiol (VIVELLE-DOT) 0.0375 MG/24HR APPLY 1 PATCH TO SKIN TWO TIMES A WEEK TRANSDERMAL 30 DAY(S)   hydrochlorothiazide (HYDRODIURIL) 25 MG tablet    metroNIDAZOLE  (FLAGYL ) 500 MG tablet Take 1 tablet (500 mg total) by mouth 2 (two) times daily. (Patient not taking: Reported on 11/27/2023)   naproxen (NAPROSYN) 500 MG tablet TAKE 1 TABLET BY MOUTH TWICE A DAY WITH FOOD FOR INFLAMMATION (Patient not taking: Reported on 11/27/2023)   Semaglutide, 1 MG/DOSE, (OZEMPIC, 1 MG/DOSE,) 4 MG/3ML SOPN INJECT 1MG  SUBCUTANEOUS ONCE A WEEK 30 DAYS (Patient not taking: Reported on 11/27/2023)   No facility-administered encounter medications on file as of 11/27/2023.    Past Medical History:  Diagnosis Date   Allergy    Anxiety    Depression    Hypertension     Past Surgical History:  Procedure Laterality Date   BREAST BIOPSY Right 07/2020   BREAST SURGERY     bunion/hammer toe surgery     CESAREAN SECTION     COSMETIC SURGERY     REDUCTION MAMMAPLASTY Bilateral    2010    Family History  Problem Relation Age of Onset   Hypertension Father    Diabetes Brother    Diabetes Maternal Aunt    Diabetes Other    Breast cancer Neg Hx     Social History   Socioeconomic History   Marital status: Married    Spouse name: Not on file   Number of children: Not on file   Years of education: Not on file   Highest education  level: Not on file  Occupational History   Not on file  Tobacco Use   Smoking status: Never   Smokeless tobacco: Never  Vaping Use   Vaping status: Never Used  Substance and Sexual Activity   Alcohol  use: No   Drug use: No   Sexual activity: Yes    Birth control/protection: I.U.D.  Other Topics Concern   Not on file  Social History Narrative   Not on file   Social Drivers of Health   Financial Resource Strain: Not on file  Food Insecurity: Not on file  Transportation Needs: Not on file  Physical Activity: Not on file  Stress: Not on file (05/03/2023)  Social Connections: Not on file  Intimate Partner Violence: Not on file    ROS      Objective    BP (!) 177/98 (BP Location: Left Arm, Patient Position: Sitting, Cuff Size: Normal)   Pulse 77   Ht 5\' 2"  (1.575 m)   LMP  (LMP Unknown)   SpO2 98%   BMI 28.72 kg/m   Physical Exam  {Labs (Optional):23779}    Assessment & Plan:   Problem List Items Addressed This Visit   None   No follow-ups on file.   Etter Hermann Mayers, PA-C

## 2023-11-28 LAB — CBC WITH DIFFERENTIAL/PLATELET
Basophils Absolute: 0 10*3/uL (ref 0.0–0.2)
Basos: 1 %
EOS (ABSOLUTE): 0 10*3/uL (ref 0.0–0.4)
Eos: 1 %
Hematocrit: 41.4 % (ref 34.0–46.6)
Hemoglobin: 13.1 g/dL (ref 11.1–15.9)
Immature Grans (Abs): 0 10*3/uL (ref 0.0–0.1)
Immature Granulocytes: 0 %
Lymphocytes Absolute: 1.6 10*3/uL (ref 0.7–3.1)
Lymphs: 38 %
MCH: 30.5 pg (ref 26.6–33.0)
MCHC: 31.6 g/dL (ref 31.5–35.7)
MCV: 96 fL (ref 79–97)
Monocytes Absolute: 0.3 10*3/uL (ref 0.1–0.9)
Monocytes: 8 %
Neutrophils Absolute: 2.3 10*3/uL (ref 1.4–7.0)
Neutrophils: 52 %
Platelets: 255 10*3/uL (ref 150–450)
RBC: 4.3 x10E6/uL (ref 3.77–5.28)
RDW: 12.5 % (ref 11.7–15.4)
WBC: 4.3 10*3/uL (ref 3.4–10.8)

## 2023-11-28 LAB — COMP. METABOLIC PANEL (12)
AST: 30 IU/L (ref 0–40)
Albumin: 4.1 g/dL (ref 3.8–4.9)
Alkaline Phosphatase: 161 IU/L — ABNORMAL HIGH (ref 44–121)
BUN/Creatinine Ratio: 15 (ref 9–23)
BUN: 13 mg/dL (ref 6–24)
Bilirubin Total: 0.3 mg/dL (ref 0.0–1.2)
Calcium: 9.1 mg/dL (ref 8.7–10.2)
Chloride: 107 mmol/L — ABNORMAL HIGH (ref 96–106)
Creatinine, Ser: 0.87 mg/dL (ref 0.57–1.00)
Globulin, Total: 2.8 g/dL (ref 1.5–4.5)
Glucose: 97 mg/dL (ref 70–99)
Potassium: 4.6 mmol/L (ref 3.5–5.2)
Sodium: 141 mmol/L (ref 134–144)
Total Protein: 6.9 g/dL (ref 6.0–8.5)
eGFR: 80 mL/min/{1.73_m2} (ref >59)

## 2023-11-28 LAB — VITAMIN D 25 HYDROXY (VIT D DEFICIENCY, FRACTURES): Vit D, 25-Hydroxy: 14.2 ng/mL — ABNORMAL LOW (ref 30.0–100.0)

## 2023-11-28 LAB — TSH: TSH: 0.85 u[IU]/mL (ref 0.450–4.500)

## 2023-11-29 ENCOUNTER — Ambulatory Visit: Payer: Self-pay | Admitting: Physician Assistant

## 2023-11-29 DIAGNOSIS — F333 Major depressive disorder, recurrent, severe with psychotic symptoms: Secondary | ICD-10-CM | POA: Insufficient documentation

## 2023-11-29 DIAGNOSIS — F411 Generalized anxiety disorder: Secondary | ICD-10-CM | POA: Insufficient documentation

## 2023-11-29 DIAGNOSIS — R748 Abnormal levels of other serum enzymes: Secondary | ICD-10-CM

## 2023-11-29 DIAGNOSIS — I10 Essential (primary) hypertension: Secondary | ICD-10-CM | POA: Insufficient documentation

## 2023-11-29 DIAGNOSIS — E559 Vitamin D deficiency, unspecified: Secondary | ICD-10-CM

## 2023-11-29 MED ORDER — VITAMIN D (ERGOCALCIFEROL) 1.25 MG (50000 UNIT) PO CAPS
50000.0000 [IU] | ORAL_CAPSULE | ORAL | 2 refills | Status: AC
Start: 2023-11-29 — End: ?

## 2023-12-05 ENCOUNTER — Other Ambulatory Visit: Payer: Self-pay | Admitting: Physician Assistant

## 2023-12-05 DIAGNOSIS — F411 Generalized anxiety disorder: Secondary | ICD-10-CM

## 2023-12-17 ENCOUNTER — Telehealth: Payer: Self-pay | Admitting: Internal Medicine

## 2023-12-17 NOTE — Telephone Encounter (Signed)
 Contacted pt to inform her of [facility] locations to complete follow up visit. LVM

## 2023-12-20 ENCOUNTER — Other Ambulatory Visit: Payer: Self-pay | Admitting: Physician Assistant

## 2023-12-20 DIAGNOSIS — F411 Generalized anxiety disorder: Secondary | ICD-10-CM

## 2024-01-24 ENCOUNTER — Other Ambulatory Visit: Payer: Self-pay | Admitting: Physician Assistant

## 2024-01-24 DIAGNOSIS — F411 Generalized anxiety disorder: Secondary | ICD-10-CM

## 2024-04-08 ENCOUNTER — Other Ambulatory Visit: Payer: Self-pay | Admitting: Internal Medicine

## 2024-04-08 DIAGNOSIS — Z1231 Encounter for screening mammogram for malignant neoplasm of breast: Secondary | ICD-10-CM

## 2024-04-30 ENCOUNTER — Ambulatory Visit
Admission: RE | Admit: 2024-04-30 | Discharge: 2024-04-30 | Disposition: A | Discharge status: Home / Self Care | Source: Ambulatory Visit | Attending: Internal Medicine | Admitting: Internal Medicine

## 2024-04-30 DIAGNOSIS — Z1231 Encounter for screening mammogram for malignant neoplasm of breast: Secondary | ICD-10-CM
